# Patient Record
Sex: Female | Born: 1975 | Race: Black or African American | Hispanic: No | Marital: Married | Smoking: Never smoker
Health system: Southern US, Community
[De-identification: ages and names within clinical notes are randomized; demographics above are authoritative.]

## PROBLEM LIST (undated history)

## (undated) DIAGNOSIS — R519 Headache, unspecified: Secondary | ICD-10-CM

## (undated) DIAGNOSIS — F32A Depression, unspecified: Secondary | ICD-10-CM

## (undated) DIAGNOSIS — F419 Anxiety disorder, unspecified: Secondary | ICD-10-CM

## (undated) DIAGNOSIS — F329 Major depressive disorder, single episode, unspecified: Secondary | ICD-10-CM

## (undated) DIAGNOSIS — R51 Headache: Secondary | ICD-10-CM

## (undated) DIAGNOSIS — Z8669 Personal history of other diseases of the nervous system and sense organs: Secondary | ICD-10-CM

## (undated) DIAGNOSIS — Z87442 Personal history of urinary calculi: Secondary | ICD-10-CM

## (undated) DIAGNOSIS — R21 Rash and other nonspecific skin eruption: Secondary | ICD-10-CM

## (undated) DIAGNOSIS — J302 Other seasonal allergic rhinitis: Secondary | ICD-10-CM

## (undated) DIAGNOSIS — R7303 Prediabetes: Secondary | ICD-10-CM

## (undated) DIAGNOSIS — E785 Hyperlipidemia, unspecified: Secondary | ICD-10-CM

## (undated) DIAGNOSIS — M65841 Other synovitis and tenosynovitis, right hand: Secondary | ICD-10-CM

## (undated) HISTORY — DX: Personal history of urinary calculi: Z87.442

## (undated) HISTORY — PX: WISDOM TOOTH EXTRACTION: SHX21

---

## 2000-01-08 ENCOUNTER — Other Ambulatory Visit: Admission: RE | Admit: 2000-01-08 | Discharge: 2000-01-08 | Payer: Self-pay | Admitting: Obstetrics and Gynecology

## 2000-11-16 ENCOUNTER — Encounter: Payer: Self-pay | Admitting: Emergency Medicine

## 2000-11-16 ENCOUNTER — Emergency Department (HOSPITAL_COMMUNITY): Admission: EM | Admit: 2000-11-16 | Discharge: 2000-11-16 | Payer: Self-pay | Admitting: *Deleted

## 2000-11-20 ENCOUNTER — Emergency Department (HOSPITAL_COMMUNITY): Admission: EM | Admit: 2000-11-20 | Discharge: 2000-11-20 | Payer: Self-pay | Admitting: Emergency Medicine

## 2000-12-26 ENCOUNTER — Other Ambulatory Visit: Admission: RE | Admit: 2000-12-26 | Discharge: 2000-12-26 | Payer: Self-pay | Admitting: Obstetrics and Gynecology

## 2001-07-24 ENCOUNTER — Inpatient Hospital Stay (HOSPITAL_COMMUNITY): Admission: AD | Admit: 2001-07-24 | Discharge: 2001-07-26 | Payer: Self-pay | Admitting: Obstetrics and Gynecology

## 2002-03-24 ENCOUNTER — Other Ambulatory Visit: Admission: RE | Admit: 2002-03-24 | Discharge: 2002-03-24 | Payer: Self-pay | Admitting: Obstetrics and Gynecology

## 2002-08-30 ENCOUNTER — Other Ambulatory Visit: Admission: RE | Admit: 2002-08-30 | Discharge: 2002-08-30 | Payer: Self-pay | Admitting: Obstetrics and Gynecology

## 2003-01-28 ENCOUNTER — Other Ambulatory Visit: Admission: RE | Admit: 2003-01-28 | Discharge: 2003-01-28 | Payer: Self-pay | Admitting: Obstetrics and Gynecology

## 2003-06-03 ENCOUNTER — Other Ambulatory Visit: Admission: RE | Admit: 2003-06-03 | Discharge: 2003-06-03 | Payer: Self-pay | Admitting: Obstetrics and Gynecology

## 2004-06-12 ENCOUNTER — Other Ambulatory Visit: Admission: RE | Admit: 2004-06-12 | Discharge: 2004-06-12 | Payer: Self-pay | Admitting: Obstetrics and Gynecology

## 2004-07-30 ENCOUNTER — Emergency Department (HOSPITAL_COMMUNITY): Admission: EM | Admit: 2004-07-30 | Discharge: 2004-07-30 | Payer: Self-pay | Admitting: Emergency Medicine

## 2005-06-13 ENCOUNTER — Other Ambulatory Visit: Admission: RE | Admit: 2005-06-13 | Discharge: 2005-06-13 | Payer: Self-pay | Admitting: Obstetrics and Gynecology

## 2006-06-18 ENCOUNTER — Other Ambulatory Visit: Admission: RE | Admit: 2006-06-18 | Discharge: 2006-06-18 | Payer: Self-pay | Admitting: Obstetrics and Gynecology

## 2009-01-29 ENCOUNTER — Emergency Department (HOSPITAL_COMMUNITY): Admission: EM | Admit: 2009-01-29 | Discharge: 2009-01-29 | Payer: Self-pay | Admitting: Emergency Medicine

## 2011-01-23 LAB — POCT I-STAT, CHEM 8
BUN: 10 mg/dL (ref 6–23)
Calcium, Ion: 1.18 mmol/L (ref 1.12–1.32)
Chloride: 103 mEq/L (ref 96–112)
Creatinine, Ser: 0.9 mg/dL (ref 0.4–1.2)
Glucose, Bld: 83 mg/dL (ref 70–99)
HCT: 47 % — ABNORMAL HIGH (ref 36.0–46.0)
Hemoglobin: 16 g/dL — ABNORMAL HIGH (ref 12.0–15.0)
Potassium: 3.2 mEq/L — ABNORMAL LOW (ref 3.5–5.1)
Sodium: 139 mEq/L (ref 135–145)
TCO2: 28 mmol/L (ref 0–100)

## 2011-01-23 LAB — PREGNANCY, URINE: Preg Test, Ur: NEGATIVE

## 2011-01-23 LAB — URINALYSIS, ROUTINE W REFLEX MICROSCOPIC
Glucose, UA: NEGATIVE mg/dL
Hgb urine dipstick: NEGATIVE
Ketones, ur: 15 mg/dL — AB
Nitrite: NEGATIVE
Protein, ur: NEGATIVE mg/dL
Specific Gravity, Urine: 1.036 — ABNORMAL HIGH (ref 1.005–1.030)
Urobilinogen, UA: 1 mg/dL (ref 0.0–1.0)
pH: 6 (ref 5.0–8.0)

## 2011-01-23 LAB — URINE MICROSCOPIC-ADD ON

## 2011-03-01 NOTE — H&P (Signed)
[facility]  Patient:    Christine Fitzgerald, Christine Fitzgerald Visit Number: [id number] MRN: [id number]          Service Type: OBS Location: [id number] Attending Physician:  Jaymes Graff A Dictated by:   Saverio Danker, C.N.M. Admit Date:  07/24/2001                           History and Physical  HISTORY OF PRESENT ILLNESS:   Christine Fitzgerald is a 35 year old single black female, gravida 1, para 0, at 40-1/7 weeks by LMP, who presents complaining of uterine contractions every 2 to 3 minutes for the last several hours.  She denies any leaking or vaginal bleeding.  She denies any headache, nausea, vomiting or visual disturbances.  Her pregnancy has been followed at [facility] by the certified nurse midwife service and has been essentially uncomplicated though at risk for positive group B strep.  OBSTETRICAL/GYNECOLOGICAL HISTORY:  She is a primigravida with an LMP of October 16, 2000, giving her an [facility] of July 23, 2001.  ALLERGIES:                    She has no known drug allergies.  GENERAL MEDICAL HISTORY:      She reports having had the usual childhood diseases.  She has no other medical problems.  PAST SURGICAL HISTORY:        Her only surgery was wisdom teeth removed in 2000.  FAMILY HISTORY:               Her family history is significant for a grandmother with chronic hypertension, mother with varicosities, brother with asthma, maternal aunt with insulin-dependent diabetes.  GENETIC HISTORY:              Her genetic history is significant for patients nephew with a deformed left arm and a son of the father of the baby has a heart murmur.  SOCIAL HISTORY:               She is single.  The father of the baby is Christine Fitzgerald; he is minimally involved.  She has good support from her family.  She is of the [employer].  She denies any illicit drug use, alcohol or smoking with this pregnancy.  PRENATAL LABORATORY DATA:     Her blood type is  B-positive, her antibody screen is negative, sickle cell trait is negative, syphilis is nonreactive, rubella is immune, hepatitis B surface antigen is negative, HIV is nonreactive, GC and Chlamydia are both negative, Pap smear was within normal limits, one-hour glucola is within normal range and a maternal serum alpha-fetoprotein was also within normal range.  Her 36-week beta strep was positive.  PHYSICAL EXAMINATION:  VITAL SIGNS:                  Her vital signs are stable.  She is afebrile.  HEENT:                        Grossly within normal limits.  HEART:                        Regular rhythm and rate.  CHEST:                        Clear.  BREASTS:  Soft and nontender.  ABDOMEN:                      Gravid with uterine contractions every two to three minutes.  Her fetal heart rate is reactive and reassuring.  PELVIC:                       Completely dilated, vertex -2 with bulging membranes.  EXTREMITIES:                  Within normal limits.  ASSESSMENT:                   1. Intrauterine pregnancy at term.                               2. Positive group B streptococcus.                               3. Second stage.  PLAN:                         Our plan is to admit to [facility], to follow routine C.N.M. orders, to give her ampicillin for group B strep prophylaxis and to notify Dr. Pierre Bali. [place]. Dictated by:   Vance Gather Duplantis, C.N.M. Attending Physician:  Michael Litter DD:  07/24/01 TD:  07/24/01 Job: [id number]

## 2011-09-23 ENCOUNTER — Encounter (INDEPENDENT_AMBULATORY_CARE_PROVIDER_SITE_OTHER): Payer: BC Managed Care – PPO | Admitting: Family Medicine

## 2011-09-23 DIAGNOSIS — E049 Nontoxic goiter, unspecified: Secondary | ICD-10-CM

## 2011-09-23 DIAGNOSIS — Z0000 Encounter for general adult medical examination without abnormal findings: Secondary | ICD-10-CM

## 2011-09-23 DIAGNOSIS — Z23 Encounter for immunization: Secondary | ICD-10-CM

## 2012-04-09 ENCOUNTER — Ambulatory Visit (INDEPENDENT_AMBULATORY_CARE_PROVIDER_SITE_OTHER): Payer: BC Managed Care – PPO | Admitting: Family Medicine

## 2012-04-09 VITALS — BP 114/76 | HR 93 | Temp 98.9°F | Resp 16 | Ht 64.0 in | Wt 183.0 lb

## 2012-04-09 DIAGNOSIS — E039 Hypothyroidism, unspecified: Secondary | ICD-10-CM

## 2012-04-09 DIAGNOSIS — N939 Abnormal uterine and vaginal bleeding, unspecified: Secondary | ICD-10-CM

## 2012-04-09 DIAGNOSIS — N898 Other specified noninflammatory disorders of vagina: Secondary | ICD-10-CM

## 2012-04-09 DIAGNOSIS — K625 Hemorrhage of anus and rectum: Secondary | ICD-10-CM

## 2012-04-09 LAB — POCT WET PREP WITH KOH
KOH Prep POC: NEGATIVE
Trichomonas, UA: NEGATIVE
WBC Wet Prep HPF POC: NEGATIVE
Yeast Wet Prep HPF POC: NEGATIVE

## 2012-04-09 LAB — POCT UA - MICROSCOPIC ONLY
Bacteria, U Microscopic: NEGATIVE
Casts, Ur, LPF, POC: NEGATIVE
Crystals, Ur, HPF, POC: NEGATIVE
Epithelial cells, urine per micros: NEGATIVE
Mucus, UA: NEGATIVE
Yeast, UA: NEGATIVE

## 2012-04-09 LAB — POCT URINALYSIS DIPSTICK
Protein, UA: NEGATIVE
Spec Grav, UA: <=1.005
Urobilinogen, UA: 0.2

## 2012-04-09 LAB — POCT URINE PREGNANCY: Preg Test, Ur: NEGATIVE

## 2012-04-09 LAB — TSH: TSH: 1.278 u[IU]/mL (ref 0.350–4.500)

## 2012-04-09 LAB — IFOBT (OCCULT BLOOD): IFOBT: NEGATIVE

## 2012-04-09 NOTE — Progress Notes (Signed)
Patient Name: Kendelle Schweers Date of Birth: 02/12/1976 Medical Record Number: [id number] Gender: female Date of Encounter: 04/09/2012  History of Present Illness:  Catina Nuss is a 36 y.o. very pleasant female patient who presents with the following:  She has noted rectal and vaginal bleeding.  This past Saturday she noted LLQ pain which continued until Sunday. (Today is Thursday.)  She feels that she had vaginal and rectal bleeding at that time- she wiped and noted blood from both.  Then again today she noted bleeding from both areas- however she has no pain this time.  She thought she had better be evaluated to be sure nothing was amiss.   No dysuria or frequency.  No pain with BM, no BRBPR or melena.  No GI issues, no anorexia or diarrhea. She did have some loose stools last week, but these have resolved.    LMP 03/23/12- however the period seemed to be very light and kind of unusual.  She admits that she has been quite stressed lately.  She is not on any contraception.    Last TSH check about a year ago  There is no problem list on file for this patient.  No past medical history on file. No past surgical history on file. History  Substance Use Topics  . Smoking status: Never Smoker   . Smokeless tobacco: Not on file  . Alcohol Use: Not on file   No family history on file. No Known Allergies  Medication list has been reviewed and updated.  Prior to Admission medications   Medication Sig Start Date End Date Taking? Authorizing Provider  levothyroxine (SYNTHROID, LEVOTHROID) 50 MCG tablet Take 50 mcg by mouth daily.   Yes Historical Provider, MD    Review of Systems:  As per HPI- otherwise negative.   Physical Examination: Filed Vitals:   04/09/12 1316  BP: 114/76  Pulse: 93  Temp: 98.9 F (37.2 C)  Resp: 16   Filed Vitals:   04/09/12 1316  Height: 5\' 4"  (1.626 m)  Weight: 183 lb (83.008 kg)   Body mass index is 31.41 kg/(m^2). Ideal Body Weight: Weight in  (lb) to have BMI = 25: 145.3   GEN: WDWN, NAD, Non-toxic, A & O x 3, overweight HEENT: Atraumatic, Normocephalic. Neck supple. No masses, No LAD.  Oropharynx wnl Ears and Nose: No external deformity. CV: RRR, No M/G/R. No JVD. No thrill. No extra heart sounds. PULM: CTA B, no wheezes, crackles, rhonchi. No retractions. No resp. distress. No accessory muscle use. ABD: S, NT, ND. No rebound. No HSM. EXTR: No c/c/e NEURO Normal gait.  PSYCH: Normally interactive. Conversant. Not depressed or anxious appearing.  Calm demeanor.  GU: normal external genitalia.  Vaginal exam reveals a small amount of bleeding from the cervix/ uterus.  No lesions or abnormal discharge. Normal bimanual exam and normal adnexa.  There is no apparent rectal bleeding.    Results for orders placed in visit on 04/09/12  POCT URINE PREGNANCY      Component Value Range   Preg Test, Ur Negative    POCT UA - MICROSCOPIC ONLY      Component Value Range   WBC, Ur, HPF, POC 0-1     RBC, urine, microscopic 1-2     Bacteria, U Microscopic negative     Mucus, UA negative     Epithelial cells, urine per micros negative     Crystals, Ur, HPF, POC negative     Casts, Ur, LPF, POC negative  Yeast, UA negative    POCT URINALYSIS DIPSTICK      Component Value Range   Color, UA yellow     Clarity, UA clear     Glucose, UA negative     Bilirubin, UA negative     Ketones, UA negative     Spec Grav, UA <=1.005     Blood, UA trace-intact     pH, UA 6.0     Protein, UA negative     Urobilinogen, UA 0.2     Nitrite, UA negative     Leukocytes, UA Negative    IFOBT (OCCULT BLOOD)      Component Value Range   IFOBT Negative    POCT WET PREP WITH KOH      Component Value Range   Trichomonas, UA Negative     Clue Cells Wet Prep HPF POC negative     Epithelial Wet Prep HPF POC 0-2     Yeast Wet Prep HPF POC negative     Bacteria Wet Prep HPF POC trace     RBC Wet Prep HPF POC negative     WBC Wet Prep HPF POC negative       KOH Prep POC Negative     Assessment and Plan: 1. Vaginal bleeding  POCT urine pregnancy, POCT UA - Microscopic Only, POCT urinalysis dipstick, POCT Wet Prep with KOH, GC/chlamydia probe amp, genital  2. Rectal bleeding  IFOBT POC (occult bld, rslt in office)  3. Hypothyroid  TSH   It seems that Mariadejesus's bleeding is actually just from her vagina.  Her FOBT is negative today, and her exam shows only vaginal bleeding. She is likely having some spotting and menstrual cycle irregularity, which may be due to stress.  Also check a TSH and genprobe. Consider possibility of missed spontaneous abortion, but a urine HCG is negative today, making this much less likely.  She declined a blood HCG today.  As she is not in any pain at this point a dangerous etiology is unlikely.  She will call or RTC if her pain returns, or if she continues to have irregular bleeding.  Will plan further follow- up pending labs.    Abbe Amsterdam, MD

## 2012-04-10 ENCOUNTER — Encounter: Payer: Self-pay | Admitting: Family Medicine

## 2012-04-15 ENCOUNTER — Ambulatory Visit
Admission: RE | Admit: 2012-04-15 | Discharge: 2012-04-15 | Disposition: A | Discharge status: Home / Self Care | Payer: BC Managed Care – PPO | Source: Ambulatory Visit | Attending: Emergency Medicine | Admitting: Emergency Medicine

## 2012-04-15 ENCOUNTER — Ambulatory Visit (INDEPENDENT_AMBULATORY_CARE_PROVIDER_SITE_OTHER): Payer: BC Managed Care – PPO | Admitting: Emergency Medicine

## 2012-04-15 VITALS — BP 112/76 | HR 96 | Temp 98.5°F | Resp 18 | Ht 63.25 in | Wt 183.0 lb

## 2012-04-15 DIAGNOSIS — R109 Unspecified abdominal pain: Secondary | ICD-10-CM

## 2012-04-15 DIAGNOSIS — N200 Calculus of kidney: Secondary | ICD-10-CM

## 2012-04-15 DIAGNOSIS — R319 Hematuria, unspecified: Secondary | ICD-10-CM

## 2012-04-15 LAB — POCT URINALYSIS DIPSTICK
Glucose, UA: NEGATIVE
Leukocytes, UA: NEGATIVE
Nitrite, UA: NEGATIVE
Spec Grav, UA: 1.01
Urobilinogen, UA: 0.2

## 2012-04-15 LAB — POCT URINE PREGNANCY: Preg Test, Ur: NEGATIVE

## 2012-04-15 LAB — POCT CBC
MCH, POC: 28 pg (ref 27–31.2)
MID (cbc): 0.4 (ref 0–0.9)
MPV: 9.2 fL (ref 0–99.8)
POC MID %: 6.8 %M (ref 0–12)
Platelet Count, POC: 396 10*3/uL (ref 142–424)
RBC: 4.97 M/uL (ref 4.04–5.48)
RDW, POC: 13.1 %
WBC: 5.9 10*3/uL (ref 4.6–10.2)

## 2012-04-15 LAB — POCT UA - MICROSCOPIC ONLY
Casts, Ur, LPF, POC: NEGATIVE
Yeast, UA: NEGATIVE

## 2012-04-15 NOTE — Progress Notes (Signed)
Subjective:    Patient ID: Christine Fitzgerald, female    DOB: 1976/04/25, 36 y.o.   MRN: [id number]  HPI  patient enters for followup after recently being seen by Dr. Dallas Schimke for evaluation of abdominal pain. At that time was normal and stool was negative for blood today for recheck because she continues to have left lower quadrant abdominal pain she states she feels there is blood in her urine off and on. An urge to pee and sometimes is unable to pass her urine. She intermittently has had some blood in her stool but not recently. She had a pregnancy test here on her last visit and it was negative. Her last menstrual period was June 10.    Review of Systems     Objective:   Physical Exam  Eyes: Pupils are equal, round, and reactive to light.  Neck: No thyromegaly present.  Cardiovascular: Normal rate and regular rhythm.   Pulmonary/Chest: Effort normal and breath sounds normal.  Abdominal:       The abdomen was soft. There is significant tenderness deep in the left lower quadrant associated with abdominal guarding.    Results for orders placed in visit on 04/15/12  POCT CBC      Component Value Range   WBC 5.9  4.6 - 10.2 K/uL   Lymph, poc 1.6  0.6 - 3.4   POC LYMPH PERCENT 27.5  10 - 50 %L   MID (cbc) 0.4  0 - 0.9   POC MID % 6.8  0 - 12 %M   POC Granulocyte 3.9  2 - 6.9   Granulocyte percent 65.7  37 - 80 %G   RBC 4.97  4.04 - 5.48 M/uL   Hemoglobin 13.9  12.2 - 16.2 g/dL   HCT, POC 84.6  96.2 - 47.9 %   MCV 89.2  80 - 97 fL   MCH, POC 28.0  27 - 31.2 pg   MCHC 31.4 (*) 31.8 - 35.4 g/dL   RDW, POC 95.2     Platelet Count, POC 396  142 - 424 K/uL   MPV 9.2  0 - 99.8 fL  POCT UA - MICROSCOPIC ONLY      Component Value Range   WBC, Ur, HPF, POC 0-2     RBC, urine, microscopic TNTC     Bacteria, U Microscopic 1+     Mucus, UA trace     Epithelial cells, urine per micros 1-3     Crystals, Ur, HPF, POC neg     Casts, Ur, LPF, POC neg     Yeast, UA neg    POCT URINALYSIS  DIPSTICK      Component Value Range   Color, UA orange     Clarity, UA cloudy     Glucose, UA neg     Bilirubin, UA neg     Ketones, UA neg     Spec Grav, UA 1.010     Blood, UA large     pH, UA 5.5     Protein, UA 100     Urobilinogen, UA 0.2     Nitrite, UA neg     Leukocytes, UA Negative    POCT URINE PREGNANCY      Component Value Range   Preg Test, Ur Negative          Assessment & Plan:  I suspect the patient's try to pass a kidney stone to given strainers. to go imaging and have her CT done at 1220 at  grams per imaging.

## 2012-04-30 ENCOUNTER — Other Ambulatory Visit: Payer: Self-pay | Admitting: *Deleted

## 2012-04-30 DIAGNOSIS — N200 Calculus of kidney: Secondary | ICD-10-CM

## 2012-04-30 NOTE — Addendum Note (Signed)
Addended by: Thelma Barge D on: 04/30/2012 12:03 PM   Modules accepted: Orders

## 2012-05-05 LAB — STONE ANALYSIS: Stone Weight KSTONE: 0.032 g

## 2013-01-14 ENCOUNTER — Ambulatory Visit (INDEPENDENT_AMBULATORY_CARE_PROVIDER_SITE_OTHER): Payer: BC Managed Care – PPO | Admitting: Emergency Medicine

## 2013-01-14 VITALS — BP 109/72 | HR 80 | Temp 99.6°F | Resp 16 | Ht 63.75 in | Wt 181.0 lb

## 2013-01-14 DIAGNOSIS — K068 Other specified disorders of gingiva and edentulous alveolar ridge: Secondary | ICD-10-CM

## 2013-01-14 DIAGNOSIS — K056 Periodontal disease, unspecified: Secondary | ICD-10-CM

## 2013-01-14 DIAGNOSIS — R0981 Nasal congestion: Secondary | ICD-10-CM

## 2013-01-14 DIAGNOSIS — J3489 Other specified disorders of nose and nasal sinuses: Secondary | ICD-10-CM

## 2013-01-14 LAB — POCT CBC
Granulocyte percent: 67.1 %G (ref 37–80)
HCT, POC: 42.2 % (ref 37.7–47.9)
Hemoglobin: 13.8 g/dL (ref 12.2–16.2)
Lymph, poc: 2.3 (ref 0.6–3.4)
MCH, POC: 29.2 pg (ref 27–31.2)
MCHC: 32.7 g/dL (ref 31.8–35.4)
MCV: 89.2 fL (ref 80–97)
MID (cbc): 0.5 (ref 0–0.9)
MPV: 9.4 fL (ref 0–99.8)
POC Granulocyte: 5.6 (ref 2–6.9)
POC LYMPH PERCENT: 26.9 %L (ref 10–50)
POC MID %: 6 %M (ref 0–12)
Platelet Count, POC: 388 10*3/uL (ref 142–424)
RBC: 4.73 M/uL (ref 4.04–5.48)
RDW, POC: 12.9 %
WBC: 8.4 10*3/uL (ref 4.6–10.2)

## 2013-01-14 MED ORDER — VALACYCLOVIR HCL 1 G PO TABS: 2000.0000 mg | ORAL_TABLET | Freq: Two times a day (BID) | ORAL | Status: DC

## 2013-01-14 NOTE — Progress Notes (Signed)
Subjective:    Patient ID: Christine Fitzgerald, female    DOB: [birth year], 37 y.o.   MRN: [id number]  HPI 37 year old female presents for evaluation of gum pain. States she was treated empirically for flu last week due to clinically suspicious symptoms.  She had high fevers, nasal congestion, cough, and body aches.  States those symptoms significantly improved over the course of this week and then this morning she felt fatigued again.  Has had lingering nasal congestion and slight dry cough.  Says this morning her gums hurt while she was eating an orange.  They do not hurt to touch and have not bled or drained anything.  Teeth ok.  Also noticed a bump on her lower lip this morning - slightly painful to touch but otherwise she has not noticed it.  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     Review of Systems  Constitutional: Negative for fever and chills.  HENT: Positive for congestion and rhinorrhea. Negative for ear pain, sore throat, postnasal drip and sinus pressure.   Respiratory: Positive for cough. Negative for shortness of breath and wheezing.   Gastrointestinal: Negative for nausea and vomiting.  Skin: Positive for rash (bump on lip).  Neurological: Negative for dizziness and headaches.       Objective:   Physical Exam  Constitutional: She is oriented to person, place, and time. She appears well-developed and well-nourished.  HENT:  Head: Normocephalic and atraumatic.  Right Ear: Hearing, tympanic membrane, external ear and ear canal normal.  Left Ear: Hearing, tympanic membrane, external ear and ear canal normal.  Mouth/Throat: Uvula is midline and oropharynx is clear and moist. No oral lesions. Normal dentition. No dental abscesses or edematous. No oropharyngeal exudate, posterior oropharyngeal edema, posterior oropharyngeal erythema or tonsillar abscesses.  Gums not painful to palpation. No  abscess, induration, drainage, bleeding, or ulceration noted.   Eyes: Conjunctivae are normal.  Neck: Normal range of motion.  Cardiovascular: Normal rate.   Pulmonary/Chest: Effort normal and breath sounds normal.  Neurological: She is alert and oriented to person, place, and time.  Skin:  Lower lip has small <1 cm papule. No ulceration or blister formation.    Psychiatric: She has a normal mood and affect. Her behavior is normal. Judgment and thought content normal.     Results for orders placed in visit on 01/14/13  POCT CBC      Result Value Range   WBC 8.4  4.6 - 10.2 K/uL   Lymph, poc 2.3  0.6 - 3.4   POC LYMPH PERCENT 26.9  10 - 50 %L   MID (cbc) 0.5  0 - 0.9   POC MID % 6.0  0 - 12 %M   POC Granulocyte 5.6  2 - 6.9   Granulocyte percent 67.1  37 - 80 %G   RBC 4.73  4.04 - 5.48 M/uL   Hemoglobin 13.8  12.2 - 16.2 g/dL   HCT, POC 14.7  82.9 - 47.9 %   MCV 89.2  80 - 97 fL   MCH, POC 29.2  27 - 31.2 pg   MCHC 32.7  31.8 - 35.4 g/dL   RDW, POC 56.2     Platelet Count, POC 388  142 - 424 K/uL   MPV 9.4  0 - 99.8 fL        Assessment & Plan:  Pain in gums - Plan: POCT  CBC  Nasal congestion - Plan: POCT CBC  Likely early cold sore.  Go ahead and take Valtrex 2000 mg twice daily x 1 day.  RTC precautions including fever >100.0, drainage, nausea, vomiting, etc.  OOW today. Ok to go back on 01/18/13. If symptoms have not improved, may need recheck.

## 2013-08-08 ENCOUNTER — Ambulatory Visit (INDEPENDENT_AMBULATORY_CARE_PROVIDER_SITE_OTHER): Payer: BC Managed Care – PPO | Admitting: Internal Medicine

## 2013-08-08 VITALS — BP 110/70 | HR 83 | Temp 98.8°F | Resp 18 | Ht 64.5 in | Wt 181.0 lb

## 2013-08-08 DIAGNOSIS — Z833 Family history of diabetes mellitus: Secondary | ICD-10-CM

## 2013-08-08 DIAGNOSIS — N920 Excessive and frequent menstruation with regular cycle: Secondary | ICD-10-CM

## 2013-08-08 DIAGNOSIS — E039 Hypothyroidism, unspecified: Secondary | ICD-10-CM

## 2013-08-08 DIAGNOSIS — Z0000 Encounter for general adult medical examination without abnormal findings: Secondary | ICD-10-CM

## 2013-08-08 LAB — LIPID PANEL
HDL: 46 mg/dL (ref >39)
LDL Cholesterol: 114 mg/dL — ABNORMAL HIGH (ref 0–99)
Total CHOL/HDL Ratio: 3.6 Ratio
Triglycerides: 35 mg/dL (ref <150)

## 2013-08-08 LAB — COMPREHENSIVE METABOLIC PANEL
Albumin: 4.2 g/dL (ref 3.5–5.2)
Alkaline Phosphatase: 63 U/L (ref 39–117)
BUN: 10 mg/dL (ref 6–23)
Calcium: 9.5 mg/dL (ref 8.4–10.5)
Glucose, Bld: 80 mg/dL (ref 70–99)
Potassium: 4.4 mEq/L (ref 3.5–5.3)

## 2013-08-08 LAB — POCT CBC
Granulocyte percent: 61.4 %G (ref 37–80)
HCT, POC: 41.7 % (ref 37.7–47.9)
Lymph, poc: 1.5 (ref 0.6–3.4)
MCH, POC: 29.8 pg (ref 27–31.2)
MCHC: 32.6 g/dL (ref 31.8–35.4)
MCV: 91.5 fL (ref 80–97)
POC LYMPH PERCENT: 33.1 %L (ref 10–50)
RDW, POC: 13.3 %
WBC: 4.6 10*3/uL (ref 4.6–10.2)

## 2013-08-08 LAB — T4, FREE: Free T4: 1.13 ng/dL (ref 0.80–1.80)

## 2013-08-08 MED ORDER — NORGESTIMATE-ETH ESTRADIOL 0.25-35 MG-MCG PO TABS: 1.0000 | ORAL_TABLET | Freq: Every day | ORAL | Status: DC

## 2013-08-08 NOTE — Progress Notes (Signed)
This chart was scribed for Christine Sia, MD by [name], Medical Scribe. This patient was seen in Room/bed 9 and the patient's care was started at 10:32 AM.  Subjective:    Patient ID: Christine Fitzgerald, female    DOB: 27-Mar-1976, 37 y.o.   MRN: [id number]  HPI HPI Comments: Christine Fitzgerald is a 37 y.o. female with h/o thyroid disease who presents to [facility] requesting check up. Pt complains of irregular menstrual cycle. She states that they are never within 2.5 weeks of one another but come at different times monthly. Pt's LNMP was October 19. She denies discharge between periods. Pt doesn't take any contraceptives. The last time she had sex was last month. Pt has been with her current partner for 12 years. They do not want to have kids. Her last normal pap smear was December 2012. 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 Pt denies leg swelling or any pain. She walks and/or jogs daily and does daily exercise challenges with family members. Pt denies h/o allergies. She has family h/o DM.     Review of Systems  HENT: Negative.   Eyes: Negative.   Respiratory: Negative.   Cardiovascular: Negative.  Negative for leg swelling.  Gastrointestinal: Negative.   Endocrine:       Hx hypothy on syn 50 no recent labs  Genitourinary: Positive for menstrual problem. Negative for urgency, frequency, hematuria, vaginal discharge, difficulty urinating, pelvic pain and dyspareunia.  Neurological: Negative.   Hematological: Negative.   Psychiatric/Behavioral: Negative.    Past Medical History  Diagnosis Date  . Thyroid disease   . Allergy    History   Social History  . Marital Status: Married    Spouse Name: N/A    Number of Children: N/A  . Years of Education: N/A   Occupational History  . Not on file.   Social History Main Topics  . Smoking status: Never Smoker   . Smokeless tobacco: Not on file  . Alcohol Use: Not on file  . Drug Use: Not on file  . Sexual Activity: Not on file   Other  Topics Concern  . Not on file   Social History Narrative  . No narrative on file   History reviewed. No pertinent past surgical history. Family History  Problem Relation Age of Onset  . Diabetes Mother   . Hypertension Mother   . Hyperlipidemia Mother   . Glaucoma Mother   . Prostatitis Father    No Known Allergies      Objective:   Physical Exam  Nursing note and vitals reviewed. Constitutional: She is oriented to person, place, and time. She appears well-developed and well-nourished. No distress.  HENT:  Head: Normocephalic and atraumatic.  Right Ear: External ear normal.  Left Ear: External ear normal.  Nose: Nose normal.  Mouth/Throat: Oropharynx is clear and moist.  Eyes: Conjunctivae and EOM are normal. Pupils are equal, round, and reactive to light.  Neck: Neck supple. No thyromegaly present.  Cardiovascular: Normal rate, regular rhythm, normal heart sounds and intact distal pulses.  Exam reveals no gallop and no friction rub.   No murmur heard. Pulmonary/Chest: Effort normal and breath sounds normal. No respiratory distress. She has no wheezes. She has no rales. She exhibits no tenderness.  Abdominal: Soft. Bowel sounds are normal. She exhibits no distension and no mass.  Musculoskeletal: Normal range of motion. She exhibits no edema and no tenderness.  Lymphadenopathy:    She has no cervical adenopathy.  Neurological: She  is alert and oriented to person, place, and time. She has normal reflexes.  Skin: Skin is warm and dry. She is not diaphoretic.  Psychiatric: She has a normal mood and affect. Her behavior is normal. Judgment and thought content normal.    Results for orders placed in visit on 08/08/13  POCT CBC      Result Value Range   WBC 4.6  4.6 - 10.2 K/uL   Lymph, poc 1.5  0.6 - 3.4   POC LYMPH PERCENT 33.1  10 - 50 %L   MID (cbc) 0.3  0 - 0.9   POC MID % 5.5  0 - 12 %M   POC Granulocyte 2.8  2 - 6.9   Granulocyte percent 61.4  37 - 80 %G   RBC  4.56  4.04 - 5.48 M/uL   Hemoglobin 13.6  12.2 - 16.2 g/dL   HCT, POC 40.9  81.1 - 47.9 %   MCV 91.5  80 - 97 fL   MCH, POC 29.8  27 - 31.2 pg   MCHC 32.6  31.8 - 35.4 g/dL   RDW, POC 91.4     Platelet Count, POC 337  142 - 424 K/uL   MPV 9.6  0 - 99.8 fL  POCT GLYCOSYLATED HEMOGLOBIN (HGB A1C)      Result Value Range   Hemoglobin A1C 5.2           Assessment & Plan:  Routine general medical examination at a health care facility - Plan: POCT CBC, POCT glycosylated hemoglobin (Hb A1C), T4, free, TSH, Lipid panel, GC/Chlamydia Probe Amp, Comprehensive metabolic panel  Family history of diabetes mellitus (DM) - Plan: POCT glycosylated hemoglobin (Hb A1C), Comprehensive metabolic panel  Unspecified hypothyroidism - Plan: T4, free, TSH, Comprehensive metabolic panel  Menorrhagia - Plan: POCT CBC, GC/Chlamydia Probe Amp, Comprehensive metabolic panel, norgestimate-ethinyl estradiol (SPRINTEC 28) 0.25-35 MG-MCG tablet  Notify labs Start OCPs

## 2013-11-05 ENCOUNTER — Encounter (INDEPENDENT_AMBULATORY_CARE_PROVIDER_SITE_OTHER): Payer: Self-pay

## 2013-11-05 ENCOUNTER — Ambulatory Visit (INDEPENDENT_AMBULATORY_CARE_PROVIDER_SITE_OTHER): Payer: BC Managed Care – PPO | Admitting: Family Medicine

## 2013-11-05 ENCOUNTER — Ambulatory Visit (HOSPITAL_COMMUNITY)
Admission: RE | Admit: 2013-11-05 | Discharge: 2013-11-05 | Disposition: A | Discharge status: Home / Self Care | Payer: BC Managed Care – PPO | Source: Ambulatory Visit | Attending: Family Medicine | Admitting: Family Medicine

## 2013-11-05 ENCOUNTER — Encounter: Payer: Self-pay | Admitting: Family Medicine

## 2013-11-05 VITALS — BP 148/96 | HR 93 | Resp 16 | Ht 64.5 in | Wt 180.8 lb

## 2013-11-05 DIAGNOSIS — M545 Low back pain, unspecified: Secondary | ICD-10-CM

## 2013-11-05 DIAGNOSIS — E049 Nontoxic goiter, unspecified: Secondary | ICD-10-CM

## 2013-11-05 DIAGNOSIS — E8881 Metabolic syndrome: Secondary | ICD-10-CM

## 2013-11-05 DIAGNOSIS — G4700 Insomnia, unspecified: Secondary | ICD-10-CM

## 2013-11-05 DIAGNOSIS — B009 Herpesviral infection, unspecified: Secondary | ICD-10-CM

## 2013-11-05 DIAGNOSIS — N920 Excessive and frequent menstruation with regular cycle: Secondary | ICD-10-CM

## 2013-11-05 DIAGNOSIS — N946 Dysmenorrhea, unspecified: Secondary | ICD-10-CM

## 2013-11-05 DIAGNOSIS — E039 Hypothyroidism, unspecified: Secondary | ICD-10-CM

## 2013-11-05 DIAGNOSIS — R21 Rash and other nonspecific skin eruption: Secondary | ICD-10-CM

## 2013-11-05 DIAGNOSIS — E010 Iodine-deficiency related diffuse (endemic) goiter: Secondary | ICD-10-CM

## 2013-11-05 DIAGNOSIS — Z23 Encounter for immunization: Secondary | ICD-10-CM

## 2013-11-05 DIAGNOSIS — E66811 Obesity, class 1: Secondary | ICD-10-CM

## 2013-11-05 DIAGNOSIS — E669 Obesity, unspecified: Secondary | ICD-10-CM

## 2013-11-05 MED ORDER — PREDNISONE (PAK) 5 MG PO TABS: 5.0000 mg | ORAL_TABLET | ORAL | Status: DC

## 2013-11-05 MED ORDER — IBUPROFEN 800 MG PO TABS: 800.0000 mg | ORAL_TABLET | Freq: Three times a day (TID) | ORAL | Status: DC | PRN

## 2013-11-05 MED ORDER — TEMAZEPAM 7.5 MG PO CAPS: 7.5000 mg | ORAL_CAPSULE | Freq: Every evening | ORAL | Status: DC | PRN

## 2013-11-05 MED ORDER — METHYLPREDNISOLONE ACETATE 80 MG/ML IJ SUSP
80.0000 mg | Freq: Once | INTRAMUSCULAR | Status: AC
  Administered 2013-11-05: 80 mg via INTRAMUSCULAR

## 2013-11-05 MED ORDER — KETOROLAC TROMETHAMINE 60 MG/2ML IM SOLN
60.0000 mg | Freq: Once | INTRAMUSCULAR | Status: AC
  Administered 2013-11-05: 60 mg via INTRAMUSCULAR

## 2013-11-05 MED ORDER — HYDROCODONE-ACETAMINOPHEN 5-325 MG PO TABS: ORAL_TABLET | ORAL | Status: DC

## 2013-11-05 NOTE — Progress Notes (Signed)
   Subjective:    Patient ID: Blake DivineMarcia Sedeno, female    DOB: June 29, 1976, 38 y.o.   MRN: [id number]  HPI New pt evaluation of a generally healthy young lady troubled , since end October with intermittent disabling low back pain, radiating to right hip. Pain at times awakens the pt, and she has had to be out of work on 5 different occasions due to severe pain.  Prolonged sitting or standing aggravates the pain, she denies radiation to groin or lower extremities, she has no incontinence of stool or urine. C/o excessive stress both on the job and at home, she is a single parent who does have the help of her Mom who lives with her, her daughter is 37 y/o, she reports poor sleep with early  Awaking within 4 hrs of falling asleep. C/o excessively  heavy menses, with clotting and flooding despite being on the OCP, and extreme menstrual pain, this has been going on for along time.c/o new areas on both breasts, areolar areas, itchy and scaly, just bought prep with shea butter to try on the areas, no f/h breast cancer, no c/o abnormal breast d/c or mass Concerned about weight and is working on lifestyle change to improve health   Review of Systems See HPI Denies recent fever or chills. Denies sinus pressure, nasal congestion, ear pain or sore throat. Denies chest congestion, productive cough or wheezing. Denies chest pains, palpitations and leg swelling Denies abdominal pain, nausea, vomiting,diarrhea or constipation.   Denies dysuria, frequency, hesitancy or incontinence.  Denies headaches, seizures, numbness, or tingling.       Objective:   Physical Exam  Patient alert and oriented and in no cardiopulmonary distress.Pleasant   HEENT: No facial asymmetry, EOMI, no sinus tenderness,  oropharynx pink and moist.  Neck supple no adenopathy.  Chest: Clear to auscultation bilaterally.  CVS: S1, S2 no murmurs, no S3.  ABD: Soft non tender.  Ext: No edema  MS: Decreased ROM spine, significant pain  on lying flat, adequate in  shoulders, hips and knees.  Skin: Intact,  rash noted in areolar regions of both breasts, no skin breakdown or cracking in areas involved  Psych: Good eye contact, normal affect. Memory intact not anxious or depressed appearing.  CNS: CN 2-12 intact, power, tone and sensation normal throughout.       Assessment & Plan:

## 2013-11-05 NOTE — Patient Instructions (Addendum)
F/u in 6 weeks, call if you need me before Vaccines today.   For back pain, 2 injections in office and medication is sent in also. Xray low back today at hospital please   Work excuse for today return on Monday   You will be referred to gyne re menstrual pain and flooding  You will be contacted re skin concerns , in the interim use the cream you have already bought with shea butter

## 2013-11-07 DIAGNOSIS — N920 Excessive and frequent menstruation with regular cycle: Secondary | ICD-10-CM | POA: Insufficient documentation

## 2013-11-07 DIAGNOSIS — R21 Rash and other nonspecific skin eruption: Secondary | ICD-10-CM | POA: Insufficient documentation

## 2013-11-07 DIAGNOSIS — E8881 Metabolic syndrome: Secondary | ICD-10-CM | POA: Insufficient documentation

## 2013-11-07 DIAGNOSIS — E669 Obesity, unspecified: Secondary | ICD-10-CM | POA: Insufficient documentation

## 2013-11-07 DIAGNOSIS — B009 Herpesviral infection, unspecified: Secondary | ICD-10-CM | POA: Insufficient documentation

## 2013-11-07 DIAGNOSIS — E010 Iodine-deficiency related diffuse (endemic) goiter: Secondary | ICD-10-CM | POA: Insufficient documentation

## 2013-11-07 MED ORDER — CLOTRIMAZOLE-BETAMETHASONE 1-0.05 % EX CREA: TOPICAL_CREAM | CUTANEOUS | Status: DC

## 2013-11-07 NOTE — Assessment & Plan Note (Signed)
Poor sleep due to a combination of disabling back pin as well as anxiety and stress, a lot of responsibility on the job, also at home  Sleep hygiene discuseed and to strart restoril

## 2013-11-07 NOTE — Assessment & Plan Note (Signed)
Normal function on no med, h/o goiter , may need re imaging, will f/u on this

## 2013-11-07 NOTE — Assessment & Plan Note (Signed)
Pt to use shea butter to area, will need to f/u, will prescribe topical antifungal/steroid combination, if persists will refer either to derm or surgery, bilateral , low probability of pagets

## 2013-11-07 NOTE — Assessment & Plan Note (Signed)
Longstanding h/o excessive menstrual pain with flooding and clotting, despite use of contraception, refer to gyne for further management

## 2013-11-07 NOTE — Assessment & Plan Note (Signed)
The increased risk of cardiovascular disease associated with this diagnosis, and the need to consistently work on lifestyle to change this is discussed. Following  a  heart healthy diet ,commitment to 30 minutes of exercise at least 5 days per week, as well as control of blood sugar and cholesterol , and achieving a healthy weight are all the areas to be addressed .  

## 2013-11-07 NOTE — Assessment & Plan Note (Signed)
Severe back pain interfering with dailky activity also disturbing sleep x 2 3 month, localized and non radiating, xray, ant inflammatory and work excuse today. If nothing specific and no response based om n debility reported and witnessed , will refer to spine surgeon for further eval

## 2013-11-07 NOTE — Assessment & Plan Note (Signed)
  Patient educated about  the importance of commitment to a  minimum of 150 minutes of exercise per week.Currently limited by back pain. The importance of healthy food choices with portion control discussed. Encouraged to start a food diary, count calories and to consider  joining a support group.already has partnered with her family in this effort Sample diet sheets offered. Goals set by the patient for the next several months.

## 2013-11-11 ENCOUNTER — Ambulatory Visit (HOSPITAL_COMMUNITY)
Admission: RE | Admit: 2013-11-11 | Discharge: 2013-11-11 | Disposition: A | Discharge status: Home / Self Care | Payer: BC Managed Care – PPO | Source: Ambulatory Visit | Attending: Family Medicine | Admitting: Family Medicine

## 2013-11-11 DIAGNOSIS — M545 Low back pain, unspecified: Secondary | ICD-10-CM

## 2013-11-15 ENCOUNTER — Telehealth: Payer: Self-pay | Admitting: Family Medicine

## 2013-11-15 NOTE — Telephone Encounter (Signed)
Spoke with patient and she is aware of results.   Will call back with whom she would like to see.

## 2013-12-23 ENCOUNTER — Ambulatory Visit: Payer: BC Managed Care – PPO | Admitting: Family Medicine

## 2013-12-30 ENCOUNTER — Ambulatory Visit (INDEPENDENT_AMBULATORY_CARE_PROVIDER_SITE_OTHER): Payer: BC Managed Care – PPO | Admitting: Family Medicine

## 2013-12-30 ENCOUNTER — Encounter: Payer: Self-pay | Admitting: Family Medicine

## 2013-12-30 ENCOUNTER — Encounter (INDEPENDENT_AMBULATORY_CARE_PROVIDER_SITE_OTHER): Payer: Self-pay

## 2013-12-30 VITALS — BP 130/68 | HR 84 | Resp 18 | Ht 64.5 in | Wt 172.1 lb

## 2013-12-30 DIAGNOSIS — R079 Chest pain, unspecified: Secondary | ICD-10-CM

## 2013-12-30 DIAGNOSIS — F329 Major depressive disorder, single episode, unspecified: Secondary | ICD-10-CM

## 2013-12-30 DIAGNOSIS — F3289 Other specified depressive episodes: Secondary | ICD-10-CM

## 2013-12-30 DIAGNOSIS — E669 Obesity, unspecified: Secondary | ICD-10-CM

## 2013-12-30 DIAGNOSIS — E010 Iodine-deficiency related diffuse (endemic) goiter: Secondary | ICD-10-CM

## 2013-12-30 DIAGNOSIS — F32A Depression, unspecified: Secondary | ICD-10-CM | POA: Insufficient documentation

## 2013-12-30 DIAGNOSIS — E049 Nontoxic goiter, unspecified: Secondary | ICD-10-CM

## 2013-12-30 MED ORDER — FLUOXETINE HCL 10 MG PO TABS: 10.0000 mg | ORAL_TABLET | Freq: Every day | ORAL | Status: DC

## 2013-12-30 NOTE — Progress Notes (Signed)
   Subjective:    Patient ID: Christine Fitzgerald, female    DOB: 1975/11/02, 38 y.o.   MRN: [id number]  HPI The PT is here for follow up and re-evaluation of chronic medical conditions, medication management and review of any available recent lab and radiology data.  Preventive health is updated, specifically  Cancer screening and Immunization.   She has had gyne eval, and has f/u , may need ablation for heavy menses. States she has not been to ortho, reassured since MRI normal, states back pain may be stress related, apart form with her menses. C/o left chest pain , with and without activity, no associated diaphoresis, nausea or light headedness in the past she has had 2 episodes in the past week, this is new. C/o "no get up and go" states people note she is "not the same person she once was", feels unfulfilled in her life, and work constantly overwhelms her. Full depression screen confirms depression and she is now ready for both medication and counseling, states her daughter sees a counselor who she wants to see also      Review of Systems See HPI Denies recent fever or chills. Denies sinus pressure, nasal congestion, ear pain or sore throat. Denies chest congestion, productive cough or wheezing. Denies chest pains, palpitations and leg swelling Denies abdominal pain, nausea, vomiting,diarrhea or constipation.   Denies dysuria, frequency, hesitancy or incontinence. Denies headaches, seizures, numbness, or tingling.  Denies skin break down or rash.        Objective:   Physical Exam BP 130/68  Pulse 84  Resp 18  Ht 5' 4.5" (1.638 m)  Wt 172 lb 1.9 oz (78.073 kg)  BMI 29.10 kg/m2  SpO2 100% Patient alert and oriented and in no cardiopulmonary distress.  HEENT: No facial asymmetry, EOMI, no sinus tenderness,  oropharynx pink and moist.  Neck supple no adenopathy.Thyromegaly  Chest: Clear to auscultation bilaterally.No reproducible chest wall pain  CVS: S1, S2 no murmurs, no  S3. EKG: NSR, no ischemia, no LVH, normal EKG ABD: Soft non tender. Bowel sounds normal.  Ext: No edema  MS: Adequate ROM spine, shoulders, hips and knees.  Skin: Intact, no ulcerations or rash noted.  Psych:Fairly Good eye contact, at times very poor eye contact,tearful. Memory intact mildly  anxious and  depressed appearing.  CNS: CN 2-12 intact, power, tone and sensation normal throughout.        Assessment & Plan:  Depression Pt will start medication and is also referred to psychologist of her choice for counseling. F/u in 6 to 8 weeks. Regular exercise to be started also  Thyromegaly Thyromegaly on physical exam., rept thyroid us   Chest pain New complaint of chest pain, atypical as far as history is concerned. EKG in office today is entirely normal. Pt has a lot of anxiety and is  Depressed and I believe that she is somatizing also unconciously She is reassured that EKG is normal  Obesity (BMI 30.0-34.9) Improved. Pt applauded on succesful weight loss through lifestyle change, and encouraged to continue same. Weight loss goal set for the next several months.

## 2013-12-30 NOTE — Patient Instructions (Signed)
F/u in 6 weeks, call if you need me before  You are referred to therapist of your choice.  You are to start new medication daily for depression, this WILL help how you feel and function  Congrats on weight loss and please commit to starting exercise  You are referred for an US of your thyroid gland  EKG is normal, no sign of heart damage

## 2014-01-01 DIAGNOSIS — R079 Chest pain, unspecified: Secondary | ICD-10-CM | POA: Insufficient documentation

## 2014-01-01 NOTE — Assessment & Plan Note (Signed)
Thyromegaly on physical exam., rept thyroid us

## 2014-01-01 NOTE — Assessment & Plan Note (Signed)
Pt will start medication and is also referred to psychologist of her choice for counseling. F/u in 6 to 8 weeks. Regular exercise to be started also

## 2014-01-01 NOTE — Assessment & Plan Note (Signed)
Improved. Pt applauded on succesful weight loss through lifestyle change, and encouraged to continue same. Weight loss goal set for the next several months.  

## 2014-01-01 NOTE — Assessment & Plan Note (Signed)
New complaint of chest pain, atypical as far as history is concerned. EKG in office today is entirely normal. Pt has a lot of anxiety and is  Depressed and I believe that she is somatizing also unconciously She is reassured that EKG is normal

## 2014-01-04 ENCOUNTER — Ambulatory Visit (HOSPITAL_COMMUNITY)
Admission: RE | Admit: 2014-01-04 | Discharge: 2014-01-04 | Disposition: A | Discharge status: Home / Self Care | Payer: BC Managed Care – PPO | Source: Ambulatory Visit | Attending: Family Medicine | Admitting: Family Medicine

## 2014-01-04 DIAGNOSIS — E010 Iodine-deficiency related diffuse (endemic) goiter: Secondary | ICD-10-CM

## 2014-01-04 DIAGNOSIS — E049 Nontoxic goiter, unspecified: Secondary | ICD-10-CM | POA: Insufficient documentation

## 2014-01-07 ENCOUNTER — Other Ambulatory Visit: Payer: Self-pay | Admitting: Obstetrics and Gynecology

## 2014-01-10 ENCOUNTER — Encounter (HOSPITAL_COMMUNITY): Payer: Self-pay | Admitting: Pharmacist

## 2014-01-17 ENCOUNTER — Telehealth: Payer: Self-pay

## 2014-01-17 ENCOUNTER — Other Ambulatory Visit: Payer: Self-pay | Admitting: Family Medicine

## 2014-01-17 DIAGNOSIS — R1013 Epigastric pain: Secondary | ICD-10-CM

## 2014-01-17 DIAGNOSIS — R110 Nausea: Secondary | ICD-10-CM

## 2014-01-17 DIAGNOSIS — F4541 Pain disorder exclusively related to psychological factors: Secondary | ICD-10-CM

## 2014-01-17 NOTE — Telephone Encounter (Signed)
Pt states she continues to have headache and nausea which she has had for some time.She has not been taking any medication for either  Symptom regularly. She has endometrial ablation this week and wants to know what is a safe medication to take States she has headaches  Every week  day while working , none on the weekends, also states she feels nauseous esp on a Monday morning, no nausea on weekends, no vomit.I advised the patient that  Tylenol is safe for headache management before her  Procedure,  Since will not increase her risk of bleeeding however since she has not regularly been taking any medication would not advise she start now, she will be referred to the headache clinic since her neurologic exam is normal, I did tell her that I believe her gh headaches are stress related. As far as nausea is concerned they also I believe are stress related based on when they occur. Had normal abdominal scan in 2013, does want gall bladder re evaluated so I will refer for US of gall bladder, if negative, I would recommend GI eval for chronic nausea and she needs an H pylori test also Of note, she has not started the antidepressant prescribed at her last visit yet, and  She states that the one therapy session which she had this past Friday "did not go very well, not sure if it will help her"  Please call patient and explain that I recommend H pylori blood test for chronic nausea and explain, and send lab order to [facility] , she can go to lab in [place] where she [place], prefer that she has blood draw no later than tomorrow, does not need to fast.  Pls order referrals to headache clinic for chronic headache, also for RUQ ultrasound eval nausea, I will sign both the blood test, thank you!

## 2014-01-18 NOTE — Telephone Encounter (Signed)
Patient unable to talk on the phone when called.  She will call the office when able.

## 2014-01-20 ENCOUNTER — Ambulatory Visit (HOSPITAL_COMMUNITY)
Admission: RE | Admit: 2014-01-20 | Discharge: 2014-01-20 | Disposition: A | Discharge status: Home / Self Care | Payer: BC Managed Care – PPO | Source: Ambulatory Visit | Attending: Obstetrics and Gynecology | Admitting: Obstetrics and Gynecology

## 2014-01-20 ENCOUNTER — Encounter (HOSPITAL_COMMUNITY): Payer: BC Managed Care – PPO | Admitting: Certified Registered"

## 2014-01-20 ENCOUNTER — Ambulatory Visit (HOSPITAL_COMMUNITY): Payer: BC Managed Care – PPO | Admitting: Certified Registered"

## 2014-01-20 ENCOUNTER — Encounter (HOSPITAL_COMMUNITY): Payer: Self-pay

## 2014-01-20 ENCOUNTER — Encounter (HOSPITAL_COMMUNITY)
Admission: RE | Disposition: A | Discharge status: Home / Self Care | Payer: Self-pay | Source: Ambulatory Visit | Attending: Obstetrics and Gynecology

## 2014-01-20 DIAGNOSIS — N7013 Chronic salpingitis and oophoritis: Secondary | ICD-10-CM | POA: Insufficient documentation

## 2014-01-20 DIAGNOSIS — E039 Hypothyroidism, unspecified: Secondary | ICD-10-CM | POA: Insufficient documentation

## 2014-01-20 DIAGNOSIS — E0500 Thyrotoxicosis with diffuse goiter without thyrotoxic crisis or storm: Secondary | ICD-10-CM | POA: Diagnosis not present

## 2014-01-20 DIAGNOSIS — N736 Female pelvic peritoneal adhesions (postinfective): Secondary | ICD-10-CM | POA: Diagnosis not present

## 2014-01-20 DIAGNOSIS — N946 Dysmenorrhea, unspecified: Secondary | ICD-10-CM | POA: Diagnosis not present

## 2014-01-20 DIAGNOSIS — N949 Unspecified condition associated with female genital organs and menstrual cycle: Secondary | ICD-10-CM | POA: Diagnosis present

## 2014-01-20 DIAGNOSIS — N7011 Chronic salpingitis: Secondary | ICD-10-CM

## 2014-01-20 DIAGNOSIS — N840 Polyp of corpus uteri: Secondary | ICD-10-CM | POA: Diagnosis not present

## 2014-01-20 DIAGNOSIS — N920 Excessive and frequent menstruation with regular cycle: Secondary | ICD-10-CM | POA: Insufficient documentation

## 2014-01-20 DIAGNOSIS — Z87442 Personal history of urinary calculi: Secondary | ICD-10-CM | POA: Diagnosis not present

## 2014-01-20 HISTORY — PX: ROBOTIC ASSISTED LAPAROSCOPIC LYSIS OF ADHESION: SHX6080

## 2014-01-20 HISTORY — PX: DILATATION & CURRETTAGE/HYSTEROSCOPY WITH RESECTOCOPE: SHX5572

## 2014-01-20 HISTORY — PX: LAPAROSCOPY: SHX197

## 2014-01-20 LAB — CBC
HCT: 40.2 % (ref 36.0–46.0)
HEMOGLOBIN: 13.8 g/dL (ref 12.0–15.0)
MCH: 29.6 pg (ref 26.0–34.0)
MCHC: 34.3 g/dL (ref 30.0–36.0)
MCV: 86.3 fL (ref 78.0–100.0)
PLATELETS: 376 10*3/uL (ref 150–400)
RBC: 4.66 MIL/uL (ref 3.87–5.11)
RDW: 12.2 % (ref 11.5–15.5)
WBC: 5.8 10*3/uL (ref 4.0–10.5)

## 2014-01-20 SURGERY — DILATATION & CURETTAGE/HYSTEROSCOPY WITH RESECTOCOPE
Anesthesia: General | Site: Vagina

## 2014-01-20 MED ORDER — PHENYLEPHRINE HCL 10 MG/ML IJ SOLN
INTRAMUSCULAR | Status: DC | PRN
  Administered 2014-01-20: 80 ug via INTRAVENOUS
  Administered 2014-01-20: 40 ug via INTRAVENOUS

## 2014-01-20 MED ORDER — LIDOCAINE HCL (CARDIAC) 20 MG/ML IV SOLN
INTRAVENOUS | Status: AC
  Filled 2014-01-20: qty 5

## 2014-01-20 MED ORDER — GLYCOPYRROLATE 0.2 MG/ML IJ SOLN
INTRAMUSCULAR | Status: DC | PRN
  Administered 2014-01-20: 0.6 mg via INTRAVENOUS

## 2014-01-20 MED ORDER — ROPIVACAINE HCL 5 MG/ML IJ SOLN
INTRAMUSCULAR | Status: DC | PRN
  Administered 2014-01-20: 60 mL

## 2014-01-20 MED ORDER — ROCURONIUM BROMIDE 100 MG/10ML IV SOLN
INTRAVENOUS | Status: AC
  Filled 2014-01-20: qty 1

## 2014-01-20 MED ORDER — DOXYCYCLINE HYCLATE 100 MG IV SOLR
100.0000 mg | Freq: Once | INTRAVENOUS | Status: AC
  Administered 2014-01-20: 100 mg via INTRAVENOUS
  Filled 2014-01-20: qty 100

## 2014-01-20 MED ORDER — KETOROLAC TROMETHAMINE 30 MG/ML IJ SOLN
INTRAMUSCULAR | Status: AC
  Filled 2014-01-20: qty 1

## 2014-01-20 MED ORDER — ROPIVACAINE HCL 5 MG/ML IJ SOLN
INTRAMUSCULAR | Status: AC
  Filled 2014-01-20: qty 60

## 2014-01-20 MED ORDER — MIDAZOLAM HCL 2 MG/2ML IJ SOLN
INTRAMUSCULAR | Status: AC
  Filled 2014-01-20: qty 2

## 2014-01-20 MED ORDER — LACTATED RINGERS IV SOLN
INTRAVENOUS | Status: DC
  Administered 2014-01-20: 15:00:00 via INTRAVENOUS
  Administered 2014-01-20: 125 mL/h via INTRAVENOUS

## 2014-01-20 MED ORDER — KETOROLAC TROMETHAMINE 30 MG/ML IJ SOLN
INTRAMUSCULAR | Status: DC | PRN
  Administered 2014-01-20: 30 mg via INTRAMUSCULAR
  Administered 2014-01-20: 30 mg via INTRAVENOUS

## 2014-01-20 MED ORDER — DEXAMETHASONE SODIUM PHOSPHATE 10 MG/ML IJ SOLN
INTRAMUSCULAR | Status: DC | PRN
  Administered 2014-01-20: 10 mg via INTRAVENOUS

## 2014-01-20 MED ORDER — LIDOCAINE HCL (CARDIAC) 20 MG/ML IV SOLN
INTRAVENOUS | Status: DC | PRN
  Administered 2014-01-20: 80 mg via INTRAVENOUS

## 2014-01-20 MED ORDER — FENTANYL CITRATE 0.05 MG/ML IJ SOLN
INTRAMUSCULAR | Status: AC
  Filled 2014-01-20: qty 5

## 2014-01-20 MED ORDER — SODIUM CHLORIDE 0.9 % IJ SOLN
INTRAMUSCULAR | Status: AC
  Filled 2014-01-20: qty 50

## 2014-01-20 MED ORDER — HYDROMORPHONE HCL PF 1 MG/ML IJ SOLN
0.2500 mg | INTRAMUSCULAR | Status: DC | PRN
  Administered 2014-01-20 (×2): 0.5 mg via INTRAVENOUS

## 2014-01-20 MED ORDER — NEOSTIGMINE METHYLSULFATE 1 MG/ML IJ SOLN
INTRAMUSCULAR | Status: DC | PRN
  Administered 2014-01-20: 3 mg via INTRAVENOUS

## 2014-01-20 MED ORDER — SODIUM CHLORIDE 0.9 % IJ SOLN
INTRAMUSCULAR | Status: AC
  Filled 2014-01-20: qty 10

## 2014-01-20 MED ORDER — ONDANSETRON HCL 4 MG/2ML IJ SOLN
INTRAMUSCULAR | Status: AC
  Filled 2014-01-20: qty 2

## 2014-01-20 MED ORDER — DEXAMETHASONE SODIUM PHOSPHATE 10 MG/ML IJ SOLN
INTRAMUSCULAR | Status: AC
  Filled 2014-01-20: qty 1

## 2014-01-20 MED ORDER — MIDAZOLAM HCL 2 MG/2ML IJ SOLN
INTRAMUSCULAR | Status: DC | PRN
  Administered 2014-01-20: 2 mg via INTRAVENOUS

## 2014-01-20 MED ORDER — ONDANSETRON HCL 4 MG/2ML IJ SOLN
INTRAMUSCULAR | Status: DC | PRN
  Administered 2014-01-20: 4 mg via INTRAVENOUS

## 2014-01-20 MED ORDER — HYDROMORPHONE HCL PF 1 MG/ML IJ SOLN
INTRAMUSCULAR | Status: AC
  Filled 2014-01-20: qty 1

## 2014-01-20 MED ORDER — PROPOFOL 10 MG/ML IV BOLUS
INTRAVENOUS | Status: DC | PRN
  Administered 2014-01-20: 200 mg via INTRAVENOUS

## 2014-01-20 MED ORDER — NEOSTIGMINE METHYLSULFATE 1 MG/ML IJ SOLN
INTRAMUSCULAR | Status: AC
  Filled 2014-01-20: qty 1

## 2014-01-20 MED ORDER — BUPIVACAINE HCL (PF) 0.25 % IJ SOLN
INTRAMUSCULAR | Status: AC
  Filled 2014-01-20: qty 30

## 2014-01-20 MED ORDER — HEPARIN SODIUM (PORCINE) 5000 UNIT/ML IJ SOLN
INTRAMUSCULAR | Status: AC
  Filled 2014-01-20: qty 1

## 2014-01-20 MED ORDER — LACTATED RINGERS IV SOLN: INTRAVENOUS | Status: DC

## 2014-01-20 MED ORDER — HYDROCODONE-ACETAMINOPHEN 5-300 MG PO TABS: 1.0000 | ORAL_TABLET | Freq: Four times a day (QID) | ORAL | Status: DC | PRN

## 2014-01-20 MED ORDER — PROPOFOL 10 MG/ML IV EMUL
INTRAVENOUS | Status: AC
  Filled 2014-01-20: qty 20

## 2014-01-20 MED ORDER — LACTATED RINGERS IR SOLN
Status: DC | PRN
  Administered 2014-01-20: 3000 mL

## 2014-01-20 MED ORDER — FENTANYL CITRATE 0.05 MG/ML IJ SOLN
INTRAMUSCULAR | Status: DC | PRN
  Administered 2014-01-20: 25 ug via INTRAVENOUS
  Administered 2014-01-20 (×3): 50 ug via INTRAVENOUS
  Administered 2014-01-20: 25 ug via INTRAVENOUS
  Administered 2014-01-20: 50 ug via INTRAVENOUS

## 2014-01-20 MED ORDER — DOXYCYCLINE HYCLATE 50 MG PO CAPS: 50.0000 mg | ORAL_CAPSULE | Freq: Two times a day (BID) | ORAL | Status: DC

## 2014-01-20 MED ORDER — BUPIVACAINE HCL (PF) 0.25 % IJ SOLN
INTRAMUSCULAR | Status: DC | PRN
  Administered 2014-01-20: 22 mL

## 2014-01-20 MED ORDER — GLYCINE 1.5 % IR SOLN
Status: DC | PRN
  Administered 2014-01-20: 3000 mL

## 2014-01-20 MED ORDER — GLYCOPYRROLATE 0.2 MG/ML IJ SOLN
INTRAMUSCULAR | Status: AC
  Filled 2014-01-20: qty 3

## 2014-01-20 MED ORDER — ROCURONIUM BROMIDE 100 MG/10ML IV SOLN
INTRAVENOUS | Status: DC | PRN
  Administered 2014-01-20: 35 mg via INTRAVENOUS
  Administered 2014-01-20: 15 mg via INTRAVENOUS

## 2014-01-20 SURGICAL SUPPLY — 84 items
ADH SKN CLS APL DERMABOND .7 (GAUZE/BANDAGES/DRESSINGS) ×2
APPLICATOR COTTON TIP 6IN STRL (MISCELLANEOUS) ×3 IMPLANT
BAG URINE DRAINAGE (UROLOGICAL SUPPLIES) ×3 IMPLANT
BARRIER ADHS 3X4 INTERCEED (GAUZE/BANDAGES/DRESSINGS) ×5 IMPLANT
BRR ADH 4X3 ABS CNTRL BYND (GAUZE/BANDAGES/DRESSINGS) ×6
CABLE HIGH FREQUENCY MONO STRZ (ELECTRODE) ×1 IMPLANT
CANISTER SUCT 3000ML (MISCELLANEOUS) ×3 IMPLANT
CATH FOLEY 3WAY  5CC 16FR (CATHETERS) ×1
CATH FOLEY 3WAY 5CC 16FR (CATHETERS) ×2 IMPLANT
CATH ROBINSON RED A/P 16FR (CATHETERS) ×3 IMPLANT
CHLORAPREP W/TINT 26ML (MISCELLANEOUS) ×3 IMPLANT
CLOTH BEACON ORANGE TIMEOUT ST (SAFETY) ×3 IMPLANT
CONT PATH 16OZ SNAP LID 3702 (MISCELLANEOUS) ×3 IMPLANT
CONTAINER PREFILL 10% NBF 60ML (FORM) ×6 IMPLANT
COVER MAYO STAND STRL (DRAPES) ×3 IMPLANT
COVER TABLE BACK 60X90 (DRAPES) ×6 IMPLANT
COVER TIP SHEARS 8 DVNC (MISCELLANEOUS) ×2 IMPLANT
COVER TIP SHEARS 8MM DA VINCI (MISCELLANEOUS) ×1
DECANTER SPIKE VIAL GLASS SM (MISCELLANEOUS) ×3 IMPLANT
DERMABOND ADVANCED (GAUZE/BANDAGES/DRESSINGS) ×1
DERMABOND ADVANCED .7 DNX12 (GAUZE/BANDAGES/DRESSINGS) ×2 IMPLANT
DEVICE TROCAR PUNCTURE CLOSURE (ENDOMECHANICALS) IMPLANT
DRAPE HUG U DISPOSABLE (DRAPE) ×3 IMPLANT
DRAPE HYSTEROSCOPY (DRAPE) ×3 IMPLANT
DRAPE LG THREE QUARTER DISP (DRAPES) ×6 IMPLANT
DRAPE WARM FLUID 44X44 (DRAPE) ×3 IMPLANT
DRSG TELFA 3X8 NADH (GAUZE/BANDAGES/DRESSINGS) ×3 IMPLANT
ELECT LIGASURE LONG (ELECTRODE) IMPLANT
ELECT REM PT RETURN 9FT ADLT (ELECTROSURGICAL) ×3
ELECTRODE REM PT RTRN 9FT ADLT (ELECTROSURGICAL) ×2 IMPLANT
EVACUATOR SMOKE 8.L (FILTER) ×3 IMPLANT
FORCEPS CUTTING 33CM 5MM (CUTTING FORCEPS) IMPLANT
FORCEPS CUTTING 45CM 5MM (CUTTING FORCEPS) IMPLANT
GAUZE VASELINE 3X9 (GAUZE/BANDAGES/DRESSINGS) IMPLANT
GLOVE BIOGEL PI IND STRL 7.0 (GLOVE) ×4 IMPLANT
GLOVE BIOGEL PI INDICATOR 7.0 (GLOVE) ×2
GLOVE ECLIPSE 6.5 STRL STRAW (GLOVE) ×3 IMPLANT
GOWN STRL REUS W/TWL LRG LVL3 (GOWN DISPOSABLE) ×18 IMPLANT
KIT ACCESSORY DA VINCI DISP (KITS) ×1
KIT ACCESSORY DVNC DISP (KITS) ×2 IMPLANT
LEGGING LITHOTOMY PAIR STRL (DRAPES) ×3 IMPLANT
LOOP ANGLED CUTTING 22FR (CUTTING LOOP) ×1 IMPLANT
NDL SPNL 22GX3.5 QUINCKE BK (NEEDLE) ×2 IMPLANT
NEEDLE INSUFFLATION 120MM (ENDOMECHANICALS) ×3 IMPLANT
NEEDLE SPNL 22GX3.5 QUINCKE BK (NEEDLE) ×3 IMPLANT
NS IRRIG 1000ML POUR BTL (IV SOLUTION) ×9 IMPLANT
OCCLUDER COLPOPNEUMO (BALLOONS) IMPLANT
PACK LAPAROSCOPY BASIN (CUSTOM PROCEDURE TRAY) ×3 IMPLANT
PACK LAVH (CUSTOM PROCEDURE TRAY) ×3 IMPLANT
PACK VAGINAL MINOR WOMEN LF (CUSTOM PROCEDURE TRAY) ×3 IMPLANT
PAD DRESSING TELFA 3X8 NADH (GAUZE/BANDAGES/DRESSINGS) ×2 IMPLANT
PAD OB MATERNITY 4.3X12.25 (PERSONAL CARE ITEMS) ×3 IMPLANT
PAD PREP 24X48 CUFFED NSTRL (MISCELLANEOUS) ×6 IMPLANT
PLUG CATH AND CAP STER (CATHETERS) ×3 IMPLANT
PROTECTOR NERVE ULNAR (MISCELLANEOUS) ×6 IMPLANT
SCISSORS LAP 5X35 DISP (ENDOMECHANICALS) IMPLANT
SET CYSTO W/LG BORE CLAMP LF (SET/KITS/TRAYS/PACK) IMPLANT
SET IRRIG TUBING LAPAROSCOPIC (IRRIGATION / IRRIGATOR) ×3 IMPLANT
SET TUBING HYSTEROSCOPY 2 NDL (TUBING) IMPLANT
SOLUTION ELECTROLUBE (MISCELLANEOUS) ×3 IMPLANT
SUT VIC AB 0 CT1 27 (SUTURE) ×15
SUT VIC AB 0 CT1 27XBRD ANTBC (SUTURE) ×10 IMPLANT
SUT VICRYL 0 UR6 27IN ABS (SUTURE) ×3 IMPLANT
SUT VICRYL 4-0 PS2 18IN ABS (SUTURE) ×6 IMPLANT
SYR 50ML LL SCALE MARK (SYRINGE) ×3 IMPLANT
SYR CONTROL 10ML LL (SYRINGE) ×3 IMPLANT
SYSTEM CONVERTIBLE TROCAR (TROCAR) IMPLANT
TIP UTERINE 5.1X6CM LAV DISP (MISCELLANEOUS) IMPLANT
TIP UTERINE 6.7X10CM GRN DISP (MISCELLANEOUS) IMPLANT
TIP UTERINE 6.7X6CM WHT DISP (MISCELLANEOUS) IMPLANT
TIP UTERINE 6.7X8CM BLUE DISP (MISCELLANEOUS) ×1 IMPLANT
TOWEL OR 17X24 6PK STRL BLUE (TOWEL DISPOSABLE) ×9 IMPLANT
TROCAR 12M 150ML BLUNT (TROCAR) IMPLANT
TROCAR BALLN 12MMX100 BLUNT (TROCAR) IMPLANT
TROCAR DISP BLADELESS 8 DVNC (TROCAR) IMPLANT
TROCAR DISP BLADELESS 8MM (TROCAR)
TROCAR OPTI TIP 12M 100M (ENDOMECHANICALS) ×3 IMPLANT
TROCAR OPTI TIP 5M 100M (ENDOMECHANICALS) ×6 IMPLANT
TROCAR XCEL DIL TIP R 11M (ENDOMECHANICALS) ×3 IMPLANT
TROCAR Z-THREAD 12X150 (TROCAR) ×3 IMPLANT
TUBE HYSTEROSCOPY W Y-CONNECT (TUBING) IMPLANT
TUBING FILTER THERMOFLATOR (ELECTROSURGICAL) ×3 IMPLANT
WARMER LAPAROSCOPE (MISCELLANEOUS) ×3 IMPLANT
WATER STERILE IRR 1000ML POUR (IV SOLUTION) ×3 IMPLANT

## 2014-01-20 NOTE — Discharge Instructions (Addendum)
Warm heat to abdomen every 4 hrs x 24 hrs. Ambulate  DISCHARGE INSTRUCTIONS: Laparoscopy  The following instructions have been prepared to help you care for yourself upon your return home today.  Wound care:  Do not get the incision wet for the first 24 hours. The incision should be kept clean and dry.  The Band-Aids or dressings may be removed the day after surgery.  Should the incision become sore, red, and swollen after the first week, check with your doctor.  Personal hygiene:  Shower the day after your procedure.  Activity and limitations:  Do NOT drive or operate any equipment today.  Do NOT lift anything more than 15 pounds for 2-3 weeks after surgery.  Do NOT rest in bed all day.  Walking is encouraged. Walk each day, starting slowly with 5-minute walks 3 or 4 times a day. Slowly increase the length of your walks.  Walk up and down stairs slowly.  Do NOT do strenuous activities, such as golfing, playing tennis, bowling, running, biking, weight lifting, gardening, mowing, or vacuuming for 2-4 weeks. Ask your doctor when it is okay to start.  Diet: Eat a light meal as desired this evening. You may resume your usual diet tomorrow.  Return to work: This is dependent on the type of work you do. For the most part you can return to a desk job within a week of surgery. If you are more active at work, please discuss this with your doctor.  What to expect after your surgery: You may have a slight burning sensation when you urinate on the first day. You may have a very small amount of blood in the urine. Expect to have a small amount of vaginal discharge/light bleeding for 1-2 weeks. It is not unusual to have abdominal soreness and bruising for up to 2 weeks. You may be tired and need more rest for about 1 week. You may experience shoulder pain for 24-72 hours. Lying flat in bed may relieve it.  Call your doctor for any of the following:  Develop a fever of 100.4 or greater   Inability to urinate 6 hours after discharge from hospital  Severe pain not relieved by pain medications  Persistent of heavy bleeding at incision site  Redness or swelling around incision site after a week  Increasing nausea or vomiting  Patient Signature________________________________________ Nurse Signature_________________________________________      DO NOT TAKE ANY IBUPROFEN PRODUCTS UNTIL 9 PM TONIGHT

## 2014-01-20 NOTE — H&P (Signed)
Christine Fitzgerald is an 38 y.o. female. G1P1 BF presents for surgical management of pelvic pain and endometrial masses noted on sonohysterogram  Pertinent Gynecological History: Menses: heavy Bleeding: heavy Contraception: OCP (estrogen/progesterone) DES exposure: denies Blood transfusions: none Sexually transmitted diseases: no past history Previous GYN Procedures: none  Last mammogram: n/a Date: n/a Last pap: normal Date: 11/2013 OB History: G1, P1   Menstrual History: Menarche JWJ:XBJYage:none No LMP recorded.    Past Medical History  Diagnosis Date  . Allergy     salmon  . Thyroid disease   . Hypothyroidism   . History of kidney stones 04/2012    ureteral    History reviewed. No pertinent past surgical history.  Family History  Problem Relation Age of Onset  . Diabetes Mother   . Hypertension Mother   . Hyperlipidemia Mother   . Glaucoma Mother   . Prostatitis Father     Social History:  reports that she has never smoked. She does not have any smokeless tobacco history on file. She reports that she does not use illicit drugs. Her alcohol history is not on file.  Allergies:  Allergies  Allergen Reactions  . Salmon [Fish Allergy] Swelling    Swelling of mouth    Prescriptions prior to admission  Medication Sig Dispense Refill  . FLUoxetine (PROZAC) 10 MG tablet Take 10 mg by mouth daily. Hasn't started taking yet, will start after surgery      . norgestimate-ethinyl estradiol (ORTHO-CYCLEN,SPRINTEC,PREVIFEM) 0.25-35 MG-MCG tablet Take 1 tablet by mouth daily.      . Multiple Vitamins-Minerals (HAIR/SKIN/NAILS PO) Take 1 tablet by mouth daily.        Review of Systems  All other systems reviewed and are negative.   Blood pressure 123/101, pulse 90, temperature 98.4 F (36.9 C), temperature source Oral, resp. rate 18, SpO2 99.00%. Physical Exam  Constitutional: She is oriented to person, place, and time. She appears well-developed and well-nourished.  HENT:  Head:  Normocephalic.  Neck: Neck supple.  Respiratory: Breath sounds normal.  GI: Soft.  Musculoskeletal: She exhibits no edema.  Neurological: She is alert and oriented to person, place, and time.  Skin: Skin is warm and dry.  Psychiatric: She has a normal mood and affect.    Results for orders placed during the hospital encounter of 01/20/14 (from the past 24 hour(s))  CBC     Status: None   Collection Time    01/20/14 11:37 AM      Result Value Ref Range   WBC 5.8  4.0 - 10.5 K/uL   RBC 4.66  3.87 - 5.11 MIL/uL   Hemoglobin 13.8  12.0 - 15.0 g/dL   HCT 78.240.2  95.636.0 - 21.346.0 %   MCV 86.3  78.0 - 100.0 fL   MCH 29.6  26.0 - 34.0 pg   MCHC 34.3  30.0 - 36.0 g/dL   RDW 08.612.2  57.811.5 - 46.915.5 %   Platelets 376  150 - 400 K/uL    No results found.  Assessment/Plan: Menorrhagia Dysmenorrhea despite OCP Endometrial mass] P) dx hysteroscopy, D&C, resection of endom masses. Dx laparoscopy, poss davinci robotic resection of endometriosis if present. Risk of surgery reviewed including infection, bleeding, uterine perforation and its risk, thermal injury, injury to surrounding organ structures, fluid overload. Consent signed  Alexandre Lightsey A Allisen Pidgeon 01/20/2014, 12:59 PM

## 2014-01-20 NOTE — Transfer of Care (Signed)
Immediate Anesthesia Transfer of Care Note  Patient: Christine [name]  Procedure(s) Performed: Procedure(s) with comments: DILATATION & CURETTAGE/HYSTEROSCOPY WITH RESECTOCOPE (N/A) - 2 1/2 hrs. total LAPAROSCOPY DIAGNOSTIC (N/A)  ROBOTIC ASSISTED LAPAROSCOPIC EXTENSIVE  LYSIS OF ADHESION;  BILATERAL SALPINGECTOMY (N/A)  Patient Location: PACU  Anesthesia Type:General  Level of Consciousness: awake  Airway & Oxygen Therapy: Patient Spontanous Breathing  Post-op Assessment: Report given to PACU RN  Post vital signs: stable  Filed Vitals:   01/20/14 1212  BP: 123/101  Pulse: 90  Temp: 36.9 C  Resp: 18    Complications: No apparent anesthesia complications

## 2014-01-20 NOTE — Brief Op Note (Signed)
01/20/2014  3:36 PM  PATIENT:  Christine Fitzgerald  38 y.o. female  PRE-OPERATIVE DIAGNOSIS:  Dysmenorrhea, Endometrial Mass, Menorrhagia  POST-OPERATIVE DIAGNOSIS:  dysmenorrhea, endometrial polyps Menorrhagia, severe pelvic adhesions, hydrosalpinges  PROCEDURE:  Diagnostic hysteroscopy, hysteroscopic resection of endometrial polyps, dilation and curettage, da Vinci robotic laparoscopic resection of severe pelvic adhesions, bilateral salpingectomy  SURGEON:  Surgeon(s) and Role:    * Daruis Swaim A Jelicia Nantz, MD - Primary  PHYSICIAN ASSISTANT:   ASSISTANTS: Marlinda Mikeanya Bailey, CNM   ANESTHESIA:   general  FINDINGS; uterus with small SS fibroid, extensive adhesions of posterior cul de sac, peritubal and periovarian adhesions, bilateral hydrosalpinges, nl appendix, nl liver edge  EBL:  Total I/O In: 1500 [I.V.:1500] Out: 325 [Urine:300; Blood:25]  BLOOD ADMINISTERED:none  DRAINS: none   LOCAL MEDICATIONS USED:  MARCAINE    and BUPIVICAINE   SPECIMEN:  Source of Specimen:  fallopian tubes, emc w/ endometrial polyps  DISPOSITION OF SPECIMEN:  PATHOLOGY  COUNTS:  YES  TOURNIQUET:  * No tourniquets in log *  DICTATION: .Other Dictation: Dictation Number [id number]  PLAN OF CARE: Discharge to home after PACU  PATIENT DISPOSITION:  PACU - hemodynamically stable.   Delay start of Pharmacological VTE agent (>24hrs) due to surgical blood loss or risk of bleeding: no

## 2014-01-20 NOTE — Anesthesia Postprocedure Evaluation (Signed)
  Anesthesia Post-op Note  Patient: Christine DivineMarcia Hoshino  Procedure(s) Performed: Procedure(s) with comments: DILATATION & CURETTAGE/HYSTEROSCOPY WITH RESECTOCOPE (N/A) - 2 1/2 hrs. total LAPAROSCOPY DIAGNOSTIC (N/A)  ROBOTIC ASSISTED LAPAROSCOPIC EXTENSIVE  LYSIS OF ADHESION;  BILATERAL SALPINGECTOMY (N/A)  Patient Location: PACU  Anesthesia Type:General  Level of Consciousness: awake, alert  and sedated  Airway and Oxygen Therapy: Patient Spontanous Breathing  Post-op Pain: mild  Post-op Assessment: Post-op Vital signs reviewed, Patient's Cardiovascular Status Stable, Respiratory Function Stable, Patent Airway and No signs of Nausea or vomiting  Post-op Vital Signs: Reviewed and stable  Last Vitals:  Filed Vitals:   01/20/14 1630  BP: 103/63  Pulse: 78  Temp: 36.8 C  Resp: 14    Complications: No apparent anesthesia complications

## 2014-01-20 NOTE — Anesthesia Preprocedure Evaluation (Signed)

## 2014-01-21 ENCOUNTER — Ambulatory Visit (HOSPITAL_COMMUNITY): Payer: BC Managed Care – PPO

## 2014-01-21 ENCOUNTER — Encounter (HOSPITAL_COMMUNITY): Payer: Self-pay | Admitting: Obstetrics and Gynecology

## 2014-01-21 MED FILL — Heparin Sodium (Porcine) Inj 5000 Unit/ML: INTRAMUSCULAR | Qty: 1 | Status: AC

## 2014-01-21 NOTE — Op Note (Signed)
Christine Fitzgerald, Christine Fitzgerald                  ACCOUNT NO.:  [id number]  MEDICAL RECORD NO.:  [id number]  LOCATION:  [facility]                          FACILITY:  [facility]  PHYSICIAN:  Maxie Better, M.D.DATE OF BIRTH:  [birth year]  DATE OF PROCEDURE:  01/20/2014 DATE OF DISCHARGE:  01/20/2014                              OPERATIVE REPORT   PREOPERATIVE DIAGNOSES:  Menorrhagia, endometrial mass, dysmenorrhea/pelvic pain.  PROCEDURES:  Diagnostic hysteroscopy, hysteroscopic resection of endometrial polyps, dilation and curettage, da Vinci robotic laparoscopic extensive lysis of adhesions, bilateral salpingectomy.  POSTOPERATIVE DIAGNOSES:  Endometrial polyps, menorrhagia, dysmenorrhea/pelvic pain, severe pelvic adhesions, hydrosalpinges.  ANESTHESIA:  General.  SURGEON:  Maxie Better, M.D.  ASSISTANT:  Marlinda Mike, C.N.M.  DESCRIPTION OF PROCEDURE:  Under adequate general anesthesia, the patient was positioned for robotic surgery in the dorsal lithotomy position.  Examination under anesthesia had revealed anteverted uterus. No adnexal masses could be appreciated.  The patient was sterilely prepped and draped in usual fashion.  A 3-way Foley catheter was sterilely placed.  Bivalve speculum was placed in the vagina.  Single- tooth tenaculum was placed on the anterior lip of the cervix.  The cervix was then serially dilated up to #31 [facility] dilator.  The patient was on her cycle.  The resectoscope was introduced into the uterine cavity, polypoid lesion was noted at the lower uterine segment.  The fluffy endometrium was noted, both tubal ostia could be seen.  The resectoscope was used to resect the polyp.  The resectoscope was removed.  The cavity was then gently curetted and the procedure was then completed.  Uterus was sounded to 9 cm.  The uterine manipulator #8 size with the RUMI manipulator was attached and placed in the uterine cavity for manipulation of the uterus.  Bivalve speculum  was then removed. Attention was then turned to the abdomen.  A 0.25% Marcaine was injected and infraumbilical vertical incision was then made.  Veress needle was introduced, opening pressure of 2 was noted.  A 3 liters of CO2 was insufflated.  Veress needle was then removed.  A 12-mm disposable trocar with sleeve was introduced into the abdomen without incident.  The robotic camera was then placed through that port.  On inspection, there was no trauma to the abdomen on entry.  The upper abdomen showed normal liver edge and gallbladder.  The patient was then placed in Trendelenburg position.  The appendix was noted to be normal.  The uterus after manipulation was noted to have one subserosal right posterior fibroid, but the posterior cul-de-sac was obliterated with adhesions throughout. The right distal tube, had no fimbriated end and the left tube was encased with adhesions as well.  At that point, decision was then made to do a robotic approach, 0.25% Marcaine was injected in the lower quadrants on the right and left side, and small incision made and the robotic 8-mm ports were then placed under direct visualization in these location and in the right mid quadrant, a 5-mm assistant port was placed.  The robot was then docked to the patient's left side.  In Arm #1, the monopolar scissor was placed.  In arm #2, the PK dissector was placed.  A 60 mL of bupivacaine was instilled in the abdomen.  I then went to the surgical console.  At the surgical console, the uterus with manipulation showed the posterior pelvis much more fully and inspection confirmed what was already seen. Several layers of adhesions was noted, obliterating the posterior cul-de-sac and encasing the left greater than right ovary(adnexa).  The appendix was normal.  The right tube was tortuous with the distal end not opened.  The left tube had paratubal and distal adhesions.  The procedure was started on the right side where  all the adhesions was subsequently resected, carried all the way down to the posterior cul-de-sac and carefully lysed off of the rectosigmoid.  The areas over the bowel was resected with cold dissection.  This was carried all the way to the opposite side, freeing up the left ovary as well and finally identify the ureters bilaterally. Once all the adhesions were removed and the anatomy restored posteriorly, and the anterior cul-de-sac was inspected and no endometriosis was noted.  The left pelvic side wall adhesions were removed.  The reflection of the colon on the left, adhesions were removed as well. Given the findings of the tubes, decision was then made to remove both fallopian tubes.  Using the PK dissector, the mesosalpinx bilaterally was serially clamped, cauterized, and then cut until both fallopian tubes were removed.  They were subsequently placed in the posterior cul- de-sac.  The pelvis  was irrigated and suctioned.  Good hemostasis noted throughout.  Decision was then made to terminate the procedure. At that point, the robotic instruments were removed.  The robot was undocked.  I then went back to the patient's bedsides sterilely and using the side ports, the fallopian tubes x2 was retrieved.  Bupivacaine was then reinstilled in the abdomen.  The lower port site was removed under direct visualization and the infraumbilical site was then removed. The incisions were then once again injected with 0.25% Marcaine and the incisions were closed with 4-0 Vicryl subcuticular stitch and 0 Vicryl fascial stitch at the infraumbilical site.  The instruments from the vagina was subsequently removed.  Interceed was placed over the both fallopian tubes and the posterior cul-de-sac.  The patient intraoperatively received doxycycline given the findings of these adhesions, in the event of possible old pelvic inflammatory disease.  The Foley catheter was removed prior to transferring the patient to  the recovery room.  SPECIMEN:  Endometrial curetting with polyps and both fallopian tubes sent to Pathology.  ESTIMATED BLOOD LOSS:  20 mL.  COMPLICATION:  None.  The patient tolerated the procedure well, was transferred to recovery room in stable condition.     Maxie BetterSheronette Dallen Bunte, M.D.     [place]/MEDQ  D:  01/20/2014  T:  01/21/2014  Job:  [id number]

## 2014-01-25 NOTE — Telephone Encounter (Signed)
Patient never returned call  

## 2014-02-02 ENCOUNTER — Encounter: Payer: Self-pay | Admitting: Family Medicine

## 2014-02-16 ENCOUNTER — Encounter: Payer: Self-pay | Admitting: Family Medicine

## 2014-02-16 ENCOUNTER — Encounter (INDEPENDENT_AMBULATORY_CARE_PROVIDER_SITE_OTHER): Payer: Self-pay

## 2014-02-16 ENCOUNTER — Ambulatory Visit (INDEPENDENT_AMBULATORY_CARE_PROVIDER_SITE_OTHER): Payer: BC Managed Care – PPO | Admitting: Family Medicine

## 2014-02-16 VITALS — BP 122/80 | HR 98 | Resp 18 | Ht 64.5 in | Wt 177.0 lb

## 2014-02-16 DIAGNOSIS — E669 Obesity, unspecified: Secondary | ICD-10-CM

## 2014-02-16 DIAGNOSIS — F3289 Other specified depressive episodes: Secondary | ICD-10-CM

## 2014-02-16 DIAGNOSIS — R109 Unspecified abdominal pain: Secondary | ICD-10-CM

## 2014-02-16 DIAGNOSIS — R519 Headache, unspecified: Secondary | ICD-10-CM

## 2014-02-16 DIAGNOSIS — F329 Major depressive disorder, single episode, unspecified: Secondary | ICD-10-CM

## 2014-02-16 DIAGNOSIS — R51 Headache: Secondary | ICD-10-CM

## 2014-02-16 DIAGNOSIS — F32A Depression, unspecified: Secondary | ICD-10-CM

## 2014-02-16 NOTE — Patient Instructions (Addendum)
F/u in 5 weeks, call if you need me before  yoou need to start taking fluoxetine and are referred to therapist and psychiatry for help  Work excuse will be provided For 4 weeks starting next Monday

## 2014-02-16 NOTE — Progress Notes (Signed)
Subjective:    Patient ID: Christine Fitzgerald, female    DOB: [birth year], 38 y.o.   MRN: [id number]  HPI Pt in today stating that she is continuing to have abdominal pain post endometrial ablation which she had on April 9, states she was returned to work with no restriction on 02/01/2014 on the day that she saw her gyne for f/u. States she still had a lot of pain then, still reports being unable to lie on her stomach, and still c/o infraumbilical pain intermittent daily, every day it is a 4 u-p to an 8 daily stops her in her tracks, most times 3 times per day up to an 8 Next day called work to say unable to return was advised to stay out until 4/27, went in half day 4/27 and 4/28 , and by 4/29 and just returne d 05/4, working part days as of now. While patient was in the office , a direct call to  The gyne who did her procedure was made, dr Cherly Hensenousins, I explained that pt was c/o intermittent disabling pain post procedure, there is no h/o vaginal drainage, fever or chills, and she was requesting additional time out of work as a result. Dr Cherly Hensenousins said that there was no justification in keeping the patient out of work based on pain from the procedure that she  Recently had. I conveyed this to Christine Fitzgerald, and asked if she wanted a 2nd gyne opinion if that were even an option , she was not too keen on this, though she had originally stated that as afar as Dr Cherly Hensenousins was concerned she had only praise for the surgical work that was done, but was"not happy with her bedside manner" Christine Fitzgerald has not actually started the fluoxetine prescribed at her last visit for depression, her only interaction with a therapist to this time was "not very good" and she called in stating she was trying to locate another therapist prior to this visit. She had also called in about unrelenting headaches, was referred to the headache clinic and did not keep appointment. I have advised she reschedule that appt, and that I will help her to locate a  therapist and she needs to start the fluoxetine. I will take her out of work for 4 weeks with the clear understanding that during this time she has her mental health, stress, and coping issues addressed by mental health professionals along with any other somatic complaints that she has , as in the headache, she understands and agrees on this plan    Review of Systems See HPI Denies recent fever or chills.c/o chronic fatigue uncontrolled intermittent abdominal pain and poor sleep Denies sinus pressure, nasal congestion, ear pain or sore throat. Denies chest congestion, productive cough or wheezing. Denies chest pains, palpitations and leg swelling Denies  nausea, vomiting,diarrhea or constipation.   Denies dysuria, frequency, hesitancy or incontinence. Denies joint pain, swelling and limitation in mobility.no more significant back pain, was reassured by [facility] report and no stated intent on following this up anymore. Denies  seizures, numbness, or tingling.  Denies skin break down or rash.        Objective:   Physical Exam BP 122/80  Pulse 98  Resp 18  Ht 5' 4.5" (1.638 m)  Wt 177 lb 0.6 oz (80.305 kg)  BMI 29.93 kg/m2  SpO2 100% Patient alert and oriented and in no cardiopulmonary distress.Intermittent episodes of bending over due to  Cramping abdominal pain  HEENT: No facial asymmetry,  EOMI, no sinus tenderness,  oropharynx pink and moist.  Neck supple no adenopathy.  Chest: Clear to auscultation bilaterally.  CVS: S1, S2 no murmurs, no S3.  ABD: Soft no guarding or rebound tenderness, bowel sounds normal.   Ext: No edema  Christine: Adequate ROM spine, shoulders, hips and knees.  Skin: Intact, no ulcerations or rash noted.  Psych: Good eye contact, normal affect. Memory intact not anxious or depressed appearing.  CNS: CN 2-12 intact, power, tone and sensation normal throughout.        Assessment & Plan:  Depression Pt to start fluoxetine daily, and she is referred  to therapist and psychiatrist for assistance with care. She will be taken out of wor for 1 month,until her mental health improves, of note , she is neither suicidal or homicidal, but definitely requires stabilization of her mental health before she can function appropriately on her job  Obesity (BMI 30.0-34.9) Unchanged Patient re-educated about  the importance of commitment to a  minimum of 150 minutes of exercise per week. The importance of healthy food choices with portion control discussed. Encouraged to start a food diary, count calories and to consider  joining a support group. Sample diet sheets offered. Goals set by the patient for the next several months.     Headache disorder Still c/o uncontrolled headaches, needs to go to neurologist, reminded her of need pot follow through on referrals, states she will go to see specialist, as symptom persists  Abdominal pain ongoing c/o abdominal pain, colicky, following gyne surgery, denies change in BM, will continue to monitor. I did speak with the gyne Doc during the visit, she had no reason to believe that the pain pt was experiencing was related to recent surgery, and maintained that from her standpoint pt should n be able to work

## 2014-02-17 ENCOUNTER — Telehealth (HOSPITAL_COMMUNITY): Payer: Self-pay | Admitting: *Deleted

## 2014-02-21 NOTE — Assessment & Plan Note (Signed)
Pt to start fluoxetine daily, and she is referred to therapist and psychiatrist for assistance with care. She will be taken out of wor for 1 month,until her mental health improves, of note , she is neither suicidal or homicidal, but definitely requires stabilization of her mental health before she can function appropriately on her job

## 2014-02-25 ENCOUNTER — Telehealth: Payer: Self-pay | Admitting: Family Medicine

## 2014-02-25 NOTE — Telephone Encounter (Signed)
Noted, appreciate

## 2014-03-04 ENCOUNTER — Encounter (HOSPITAL_COMMUNITY): Payer: Self-pay | Admitting: Psychiatry

## 2014-03-04 ENCOUNTER — Ambulatory Visit (INDEPENDENT_AMBULATORY_CARE_PROVIDER_SITE_OTHER): Payer: BC Managed Care – PPO | Admitting: Psychiatry

## 2014-03-04 VITALS — BP 120/78 | Ht 64.0 in | Wt 177.0 lb

## 2014-03-04 DIAGNOSIS — F32A Depression, unspecified: Secondary | ICD-10-CM

## 2014-03-04 DIAGNOSIS — F329 Major depressive disorder, single episode, unspecified: Secondary | ICD-10-CM

## 2014-03-04 MED ORDER — FLUOXETINE HCL 20 MG PO CAPS: 20.0000 mg | ORAL_CAPSULE | Freq: Every day | ORAL | Status: DC

## 2014-03-04 NOTE — Progress Notes (Signed)
Psychiatric Assessment Adult  Patient Identification:  Christine Fitzgerald Date of Evaluation:  03/04/2014 Chief Complaint: "My job has been overwhelming." History of Chief Complaint:   Chief Complaint  Patient presents with  . Depression  . Establish Care    HPI this patient is a 38 year old separated black female who lives with her mother and 62 year old daughter in [place]. She works as a Dance movement psychotherapist for [employer].  The patient was referred by Dr. Syliva Overman, her primary care physician for further treatment and assessment of depression.  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 In 2012 she was transferred from a team of 6 people that she worked with to a new position in which she had responsibility for many departments. She was overwhelmed with work and often stayed till 10 or 11 at night. She was supposed to get an Geophysicist/field seismologist but status supervisor was tired and she claims a supervisor doesn't know how to do the job. She tried to train an Geophysicist/field seismologist and the person couldn't seem to learn to tasks necessary. The patient had become increasingly overwhelmed and stressed. She had stomachaches and headaches every time she came to work. She became increasingly depressed with low energy and low mood. She started spending most of her weekend time in bed. She's never been suicidal but feels very frustrated with her current situation. She does not use alcohol or drugs and is ever had psychotic symptoms or any previous psychiatric treatment.  On April 9 the patient had a D&C. She had a lot of pain for several weeks afterwards and has been out of work on medical leave. She tried to go back but was overwhelmed with the job again. She has to go back on June 9 and has reconciled herself to this idea but is going to try to either make some changes at work or find another job. About 2 weeks ago Dr. Shon Hale started her on Prozac 10 mg and she's starting to feel a little  better but this might be due to the fact that she's at home and doesn't have to go to work. I told her she is going to be on Prozac it would make more sense to use the 20 mg dose for in and out and she agrees. She has been seeing a therapist in [place] for several weeks and would like to continue this. Review of Systems  Constitutional: Positive for activity change.  Eyes: Negative.   Respiratory: Negative.   Cardiovascular: Negative.   Gastrointestinal: Positive for abdominal pain.  Endocrine: Negative.   Genitourinary: Positive for pelvic pain.  Musculoskeletal: Negative.   Allergic/Immunologic: Negative.   Neurological: Positive for headaches.  Hematological: Negative.   Psychiatric/Behavioral: Positive for sleep disturbance and dysphoric mood.   Physical Exam not done Depressive Symptoms: depressed mood, anhedonia, psychomotor retardation, fatigue, hopelessness,  (Hypo) Manic Symptoms:   Elevated Mood:  No Irritable Mood:  No Grandiosity:  No Distractibility:  No Labiality of Mood:  No Delusions:  No Hallucinations:  No Impulsivity:  No Sexually Inappropriate Behavior:  No Financial Extravagance:  No Flight of Ideas:  No  Anxiety Symptoms: Excessive Worry:  Yes Panic Symptoms:  No Agoraphobia:  No Obsessive Compulsive: No  Symptoms: None, Specific Phobias:  No Social Anxiety:  No  Psychotic Symptoms:  Hallucinations: No None Delusions:  No Paranoia:  No   Ideas of Reference:  No  PTSD Symptoms: Ever had a traumatic exposure:  No Had a traumatic  exposure in the last month:  No Re-experiencing: No None Hypervigilance:  No Hyperarousal: No None Avoidance: No None  Traumatic Brain Injury: No   Past Psychiatric History: Diagnosis: Depression   Hospitalizations: none  Outpatient Care: [place] in [place] for [place] weeks   Substance Abuse Care: none  Self-Mutilation: none  Suicidal Attempts: none  Violent Behaviors: none    Past Medical History:   Past Medical History  Diagnosis Date  . Allergy     salmon  . Thyroid disease   . Hypothyroidism   . History of kidney stones 04/2012    ureteral   History of Loss of Consciousness:  No Seizure History:  No Cardiac History:  No Allergies:   Allergies  Allergen Reactions  . Salmon [Fish Allergy] Swelling    Swelling of mouth   Current Medications:  Current Outpatient Prescriptions  Medication Sig Dispense Refill  . FLUoxetine (PROZAC) 20 MG capsule Take 1 capsule (20 mg total) by mouth daily.  30 capsule  2  . Hydrocodone-Acetaminophen 5-300 MG TABS Take 1 tablet by mouth every 6 (six) hours as needed.  30 each  0  . Multiple Vitamins-Minerals (HAIR/SKIN/NAILS PO) Take 1 tablet by mouth daily.      . norgestimate-ethinyl estradiol (ORTHO-CYCLEN,SPRINTEC,PREVIFEM) 0.25-35 MG-MCG tablet Take 1 tablet by mouth daily.       No current facility-administered medications for this visit.    Previous Psychotropic Medications:  Medication Dose   Prozac 10 mg daily                       Substance Abuse History in the last 12 months: Substance Age of 1st Use Last Use Amount Specific Type  Nicotine      Alcohol      Cannabis      Opiates      Cocaine      Methamphetamines      LSD      Ecstasy      Benzodiazepines      Caffeine      Inhalants      Others:                          Medical Consequences of Substance Abuse: n/a  Legal Consequences of Substance Abuse: n/a  Family Consequences of Substance Abuse: n/a  Blackouts:  No DT's:  No Withdrawal Symptoms:  No None  Social History: [place] [facility] [place] [address] [place] Place of Birth: [place] Family Members: Mother, 37 year old daughter, 6 siblings, father and [place] Marital Status:  Separated Children:   Sons:   Daughters: 37 y/o Relationships: Still sees her husband and they're trying to work out the marriage but he has had several  affairs Education:  [employer] Educational Problems/Performance:  Religious Beliefs/Practices: Christian History of Abuse: none Armed forces technical officerccupational Experiences; Environmental education officerhuman resources manager Military History:  None. Legal History: none Hobbies/Interests: Cleaning her house  Family History:   Family History  Problem Relation Age of Onset  . Diabetes Mother   . Hypertension Mother   . Hyperlipidemia Mother   . Glaucoma Mother   . Prostatitis Father     Mental Status Examination/Evaluation: Objective:  Appearance: Casual, Neat and Well Groomed  Eye Contact::  Good  Speech:  Clear and Coherent  Volume:  Normal  Mood:  Somewhat depressed   Affect:  Depressed  Thought Process:  Goal Directed  Orientation:  Full (Time,  Place, and Person)  Thought Content:  WDL  Suicidal Thoughts:  No  Homicidal Thoughts:  No  Judgement:  Good  Insight:  Good  Psychomotor Activity:  Normal  Akathisia:  No  Handed:  Right  AIMS (if indicated):    Assets:  Communication Skills Desire for Improvement Social Support Talents/Skills Vocational/Educational    Laboratory/X-Ray Psychological Evaluation(s)   Reviewed in the record      Assessment:  Axis I: Major Depression, single episode  AXIS I Major Depression, single episode  AXIS II Deferred  AXIS III Past Medical History  Diagnosis Date  . Allergy     salmon  . Thyroid disease   . Hypothyroidism   . History of kidney stones 04/2012    ureteral     AXIS IV occupational problems  AXIS V 61-70 mild symptoms   Treatment Plan/Recommendations:  Plan of Care: Medication management   Laboratory:    Psychotherapy: The patient states she would like to continue with her current counselor   Medications: Increase Prozac to 20 mg every morning   Routine PRN Medications:  No  Consultations:   Safety Concerns:  She has no thoughts of self-harm   Other: She'll return in 4 weeks     Diannia Ruder, MD 5/22/201510:33 AM

## 2014-03-09 ENCOUNTER — Ambulatory Visit (INDEPENDENT_AMBULATORY_CARE_PROVIDER_SITE_OTHER): Payer: BC Managed Care – PPO | Admitting: Psychology

## 2014-03-09 ENCOUNTER — Encounter (HOSPITAL_COMMUNITY): Payer: Self-pay | Admitting: Psychology

## 2014-03-09 DIAGNOSIS — F3289 Other specified depressive episodes: Secondary | ICD-10-CM

## 2014-03-09 DIAGNOSIS — F32A Depression, unspecified: Secondary | ICD-10-CM

## 2014-03-09 DIAGNOSIS — F329 Major depressive disorder, single episode, unspecified: Secondary | ICD-10-CM

## 2014-03-09 NOTE — Progress Notes (Signed)
    PROGRESS NOTE  Patient:  Christine Fitzgerald   DOB: 1976/05/29  MR Number: [id number]  Location: [facility] [address] [place] [address] Dept: [phone]  Start: 10 AM End: 11 AM  Provider/Observer:     Hershal Coria PSYD  Chief Complaint:      Chief Complaint  Patient presents with  . Depression  . Stress    Reason For Service:     The patient was referred by Dr. Tenny Craw after the patient's primary care Dr. Dr. Lodema Hong. The patient is a 38 year old female who's been dealing with depression and significant stress and feeling overwhelmed at work. The patient reports that she has had a lot going on the past couple of years. At first she did not identify what was happening but is now realized that depression is a major issue. The patient has been working at [employer]. for about 15 years and had been on the team of 6 when she was later moved to a position where she was essentially working on her own. While she was promised Mrs. that this never occurred. The patient began and significantly increasing her work hours point that she was working as many as 10 or more hours day. She developed a number of physical difficulties and medical issues that also were challenging to cope with. The patient describes moderate to significant symptoms of depression, anxiety, appetite changes, sleep disturbance, work stress, loss of interest, excessive worrying, low energy,. The patient reports that these have been much more significant the past 2 years.  Interventions Strategy:  Cognitive/behavioral psychotherapeutic interventions  Participation Level:   Active  Participation Quality:  Appropriate      Behavioral Observation:  Well Groomed, Alert, and Appropriate.   Current Psychosocial Factors: The patient reports that she will be back to work after a medical procedure was performed on June 6. However, she is expecting  him to a status to be very stressful as she is sure there is an excessive amount were called up. This is been historically true. We worked on some coping strategies to deal with this and some suggestions for the patient.  Content of Session:   Reviewed current symptoms and worked on coping skills to better manage and cope with significant stress and symptoms of depression including feeling overwhelmed, worrying, feeling helpless and hopeless, social isolation, sleep disturbance, and appetite changes.  Current Status:   The patient reports that she has been better while she was out of work for recovery from surgical intervention. However, as the time approaches for her return she is becoming more apprehensive and ill feelings are returning.  Patient Progress:   Stable  Target Goals:   Target goals include reducing the intensity, duration, and frequency of symptoms of depression and anxiety.  Last Reviewed:   03/09/2014  Goals Addressed Today:    Today we worked on building and establishing coping skills to better manage symptoms of depression and anxiety.  Impression/Diagnosis:   The patient is had a very stressful situation in her job over the past 2 years. Patient denies symptoms of depression prior to past 2 years but reports that these symptoms have been worsening over the it does appear that there is a major depressive issue in significant stress and anxiety primarily being driven by stress.  Diagnosis:    Axis I: Depression    RODENBOUGH,JOHN R, PsyD 03/09/2014

## 2014-03-30 ENCOUNTER — Ambulatory Visit (INDEPENDENT_AMBULATORY_CARE_PROVIDER_SITE_OTHER): Payer: BC Managed Care – PPO | Admitting: Psychology

## 2014-03-30 DIAGNOSIS — F32A Depression, unspecified: Secondary | ICD-10-CM

## 2014-03-30 DIAGNOSIS — F3289 Other specified depressive episodes: Secondary | ICD-10-CM

## 2014-03-30 DIAGNOSIS — F329 Major depressive disorder, single episode, unspecified: Secondary | ICD-10-CM

## 2014-04-01 ENCOUNTER — Ambulatory Visit (INDEPENDENT_AMBULATORY_CARE_PROVIDER_SITE_OTHER): Payer: BC Managed Care – PPO | Admitting: Psychiatry

## 2014-04-01 ENCOUNTER — Encounter (HOSPITAL_COMMUNITY): Payer: Self-pay | Admitting: Psychiatry

## 2014-04-01 VITALS — BP 110/74 | Ht 64.0 in | Wt 180.0 lb

## 2014-04-01 DIAGNOSIS — F32A Depression, unspecified: Secondary | ICD-10-CM

## 2014-04-01 DIAGNOSIS — F329 Major depressive disorder, single episode, unspecified: Secondary | ICD-10-CM

## 2014-04-01 NOTE — Progress Notes (Signed)
Patient ID: Christine Fitzgerald, female   DOB: 1976-09-08, 38 y.o.   MRN: [id number]  Psychiatric Assessment Adult  Patient Identification:  Christine Fitzgerald Date of Evaluation:  04/01/2014 Chief Complaint: "My job has been overwhelming." History of Chief Complaint:   Chief Complaint  Patient presents with  . Anxiety  . Depression  . Follow-up    Anxiety     this patient is a 38 year old separated black female who lives with her mother and 37 year old daughter in [place]. She works as a Dance movement psychotherapisthuman resources specialist for [employer]  The patient was referred by Dr. Syliva OvermanMargaret Simpson, her primary care physician for further treatment and assessment of depression.  The patient has been working for [employer] 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 In 2012 she was transferred from a team of 6 people that she worked with to a new position in which she had responsibility for many departments. She was overwhelmed with work and often stayed till 10 or 11 at night. She was supposed to get an Geophysicist/field seismologistassistant but status supervisor was tired and she claims a supervisor doesn't know how to do the job. She tried to train an Geophysicist/field seismologistassistant and the person couldn't seem to learn to tasks necessary. The patient had become increasingly overwhelmed and stressed. She had stomachaches and headaches every time she came to work. She became increasingly depressed with low energy and low mood. She started spending most of her weekend time in bed. She's never been suicidal but feels very frustrated with her current situation. She does not use alcohol or drugs and is ever had psychotic symptoms or any previous psychiatric treatment.  On April 9 the patient had a D&C. She had a lot of pain for several weeks afterwards and has been out of work on medical leave. She tried to go back but was overwhelmed with the job again. She has to go back on June 9 and has reconciled herself to this idea but is going to try to either make some changes at work or find another job.  About 2 weeks ago Dr. Shon HaleSimson started her on Prozac 10 mg and she's starting to feel a little better but this might be due to the fact that she's at home and doesn't have to go to work. I told her she is going to be on Prozac it would make more sense to use the 20 mg dose for in and out and she agrees. She has [place] therapist [place] for several weeks and would like to continue this.  The patient returns after four-week's. She is doing better. Her job situation has improved and her boss is being much more supportive and getting her help. She feels more relaxed she is sleeping better and is starting to get some exercise. She is spending more time with her daughter. Review of Systems  Constitutional: Positive for activity change.  Eyes: Negative.   Respiratory: Negative.   Cardiovascular: Negative.   Gastrointestinal: Positive for abdominal pain.  Endocrine: Negative.   Genitourinary: Positive for pelvic pain.  Musculoskeletal: Negative.   Allergic/Immunologic: Negative.   Neurological: Positive for headaches.  Hematological: Negative.   Psychiatric/Behavioral: Positive for sleep disturbance and dysphoric mood.   Physical Exam not done Depressive Symptoms: depressed mood, anhedonia, psychomotor retardation, fatigue, hopelessness,  (Hypo) Manic Symptoms:   Elevated Mood:  No Irritable Mood:  No Grandiosity:  No Distractibility:  No Labiality of Mood:  No Delusions:  No Hallucinations:  No Impulsivity:  No Sexually Inappropriate Behavior:  No Financial Extravagance:  No Flight of Ideas:  No  Anxiety Symptoms: Excessive Worry:  Yes Panic Symptoms:  No Agoraphobia:  No Obsessive Compulsive: No  Symptoms: None, Specific Phobias:  No Social Anxiety:  No  Psychotic Symptoms:  Hallucinations: No None Delusions:  No Paranoia:  No   Ideas of Reference:  No  PTSD Symptoms: Ever had a traumatic exposure:  No Had a traumatic exposure in the last month:   No Re-experiencing: No None Hypervigilance:  No Hyperarousal: No None Avoidance: No None  Traumatic Brain Injury: No   Past Psychiatric History: Diagnosis: Depression   Hospitalizations: none  Outpatient Care: Has been seeing a therapist in [place] for a few weeks   Substance Abuse Care: none  Self-Mutilation: none  Suicidal Attempts: none  Violent Behaviors: none   Past Medical History:   Past Medical History  Diagnosis Date  . Allergy     salmon  . Thyroid disease   . Hypothyroidism   . History of kidney stones 04/2012    ureteral   History of Loss of Consciousness:  No Seizure History:  No Cardiac History:  No Allergies:   Allergies  Allergen Reactions  . Salmon [Fish Allergy] Swelling    Swelling of mouth   Current Medications:  Current Outpatient Prescriptions  Medication Sig Dispense Refill  . FLUoxetine (PROZAC) 20 MG capsule Take 1 capsule (20 mg total) by mouth daily.  30 capsule  2  . Hydrocodone-Acetaminophen 5-300 MG TABS Take 1 tablet by mouth every 6 (six) hours as needed.  30 each  0  . Multiple Vitamins-Minerals (HAIR/SKIN/NAILS PO) Take 1 tablet by mouth daily.      . norgestimate-ethinyl estradiol (ORTHO-CYCLEN,SPRINTEC,PREVIFEM) 0.25-35 MG-MCG tablet Take 1 tablet by mouth daily.       No current facility-administered medications for this visit.    Previous Psychotropic Medications:  Medication Dose   Prozac 10 mg daily                       Substance Abuse History in the last 12 months: Substance Age of 1st Use Last Use Amount Specific Type  Nicotine      Alcohol      Cannabis      Opiates      Cocaine      Methamphetamines      LSD      Ecstasy      Benzodiazepines      Caffeine      Inhalants      Others:                          Medical Consequences of Substance Abuse: n/a  Legal Consequences of Substance Abuse: n/a  Family Consequences of Substance Abuse: n/a  Blackouts:  No DT's:  No Withdrawal Symptoms:   No None  Social History: Current Place of Residence: [facility] [address] of Birth: [place] Family Members: Mother, 73 year old daughter, 6 siblings, father and [place] Marital Status:  Separated Children:   Sons:   Daughters: 67 y/o Relationships: Still sees her husband and they're trying to work out the marriage but he has had several affairs Education:  Corporate treasurer Problems/Performance:  Religious Beliefs/Practices: Christian History of Abuse: none Armed forces technical officer; Environmental education officer History:  None. Legal History: none Hobbies/Interests: Cleaning her house  Family History:   Family History  Problem Relation Age of Onset  . Diabetes Mother   .  Hypertension Mother   . Hyperlipidemia Mother   . Glaucoma Mother   . Prostatitis Father     Mental Status Examination/Evaluation: Objective:  Appearance: Casual, Neat and Well Groomed  Eye Contact::  Good  Speech:  Clear and Coherent  Volume:  Normal  Mood: Good   Affect bright   Thought Process:  Goal Directed  Orientation:  Full (Time, Place, and Person)  Thought Content:  WDL  Suicidal Thoughts:  No  Homicidal Thoughts:  No  Judgement:  Good  Insight:  Good  Psychomotor Activity:  Normal  Akathisia:  No  Handed:  Right  AIMS (if indicated):    Assets:  Communication Skills Desire for Improvement Social Support Talents/Skills Vocational/Educational    Laboratory/X-Ray Psychological Evaluation(s)   Reviewed in the record      Assessment:  Axis I: Major Depression, single episode  AXIS I Major Depression, single episode  AXIS II Deferred  AXIS III Past Medical History  Diagnosis Date  . Allergy     salmon  . Thyroid disease   . Hypothyroidism   . History of kidney stones 04/2012    ureteral     AXIS IV occupational problems  AXIS V 61-70 mild symptoms   Treatment Plan/Recommendations:  Plan of Care: Medication management   Laboratory:     Psychotherapy: The patient states she would like to continue with her current counselor   Medications: Continue Prozac to 20 mg every morning   Routine PRN Medications:  No  Consultations:   Safety Concerns:  She has no thoughts of self-harm   Other: She'll return in 6 weeks     Diannia RuderOSS, DEBORAH, MD 6/19/20154:44 PM

## 2014-04-08 ENCOUNTER — Encounter (HOSPITAL_COMMUNITY): Payer: Self-pay | Admitting: Psychology

## 2014-04-08 NOTE — Progress Notes (Signed)
PROGRESS NOTE  Patient:  Christine Fitzgerald   DOB: 07/19/1976  MR Number: [id number]  Location: [facility]HAVIORAL [facility] [address] [place] [id number] Dept: [phone]  Start: 4 PM End: 5 PM  Provider/Observer:     Hershal CoriaJohn R Rodenbough PSYD  Chief Complaint:      Chief Complaint  Patient presents with  . Depression    Reason For Service:     The patient was referred by Dr. Tenny Crawoss after the patient's primary care Dr. Dr. Lodema HongSimpson. The patient is a 38 year old female who's been dealing with depression and significant stress and feeling overwhelmed at work. The patient reports that she has had a lot going on the past couple of years. At first she did not identify what was happening but is now realized that depression is a major issue. The patient has been working at [employer] for about 15 years and had been on the team of 6 when she was later moved to a position where she was essentially working on her own. While she was promised Mrs. that this never occurred. The patient began and significantly increasing her work hours point that she was working as many as 10 or more hours day. She developed a number of physical difficulties and medical issues that also were challenging to cope with. The patient describes moderate to significant symptoms of depression, anxiety, appetite changes, sleep disturbance, work stress, loss of interest, excessive worrying, low energy,. The patient reports that these have been much more significant the past 2 years.  Interventions Strategy:  Cognitive/behavioral psychotherapeutic interventions  Participation Level:   Active  Participation Quality:  Appropriate      Behavioral Observation:  Well Groomed, Alert, and Appropriate.   Current Psychosocial Factors: The patient reports that she is returned to work and that while this is been very stressful and there have been some complications overall  she is doing okay with this. They promised health in some ways they have helped her but in other ways there continue to have too much expectations and what she should accomplish a day. Most of time she has been able to leave the office at 5 PM and leaves him work behind. Her supervisors office realized that goals be expected for her was way too much when they were forced to adjust her today we worked on issues with some of stressors have to do with her daughter and the daughter's expectations at home..  Content of Session:   Reviewed current symptoms and worked on coping skills to better manage and cope with significant stress and symptoms of depression including feeling overwhelmed, worrying, feeling helpless and hopeless, social isolation, sleep disturbance, and appetite changes.  Current Status:   The patient reports that she has been better and been able to return to work, which has been successful up to this point..  Patient Progress:   Stable  Target Goals:   Target goals include reducing the intensity, duration, and frequency of symptoms of depression and anxiety.  Last Reviewed:   03/30/2014  Goals Addressed Today:    Today we worked on building and establishing coping skills to better manage symptoms of depression and anxiety.  Impression/Diagnosis:   The patient is had a very stressful situation in her job over the past 2 years. Patient denies symptoms of depression prior to past 2 years but reports that these symptoms have been worsening over the it does appear that there is a  major depressive issue in significant stress and anxiety primarily being driven by stress.  Diagnosis:    Axis I: Depression    RODENBOUGH,JOHN R, PsyD 04/08/2014

## 2014-05-16 ENCOUNTER — Ambulatory Visit (INDEPENDENT_AMBULATORY_CARE_PROVIDER_SITE_OTHER): Payer: BC Managed Care – PPO | Admitting: Psychiatry

## 2014-05-16 ENCOUNTER — Encounter (HOSPITAL_COMMUNITY): Payer: Self-pay | Admitting: Psychiatry

## 2014-05-16 VITALS — BP 120/80 | HR 79 | Ht 64.0 in | Wt 174.0 lb

## 2014-05-16 DIAGNOSIS — F3289 Other specified depressive episodes: Secondary | ICD-10-CM

## 2014-05-16 DIAGNOSIS — F32A Depression, unspecified: Secondary | ICD-10-CM

## 2014-05-16 DIAGNOSIS — F329 Major depressive disorder, single episode, unspecified: Secondary | ICD-10-CM

## 2014-05-16 MED ORDER — FLUOXETINE HCL 20 MG PO CAPS: 20.0000 mg | ORAL_CAPSULE | Freq: Every day | ORAL | Status: DC

## 2014-05-16 NOTE — Progress Notes (Signed)
Patient ID: Christine Fitzgerald, female   DOB: [birth year], 38 y.o.   MRN: [id number] Patient ID: Christine Fitzgerald, female   DOB: 04-06-1976, 38 y.o.   MRN: [id number]  Psychiatric Assessment Adult  Patient Identification:  Christine Fitzgerald Date of Evaluation:  05/16/2014 Chief Complaint: "My job has been overwhelming." History of Chief Complaint:   Chief Complaint  Patient presents with  . Depression  . Anxiety  . Follow-up    Anxiety     this patient is a 38 year old separated black female who lives with her mother and 56 year old daughter in [place]. She works as a Dance movement psychotherapist for [employer].  The patient was referred by Dr. Syliva Overman, her primary care physician for further treatment and assessment of depression.  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 In 2012 she was transferred from a team of 6 people that she worked with to a new position in which she had responsibility for many departments. She was overwhelmed with work and often stayed till 10 or 11 at night. She was supposed to get an Geophysicist/field seismologist but status supervisor was tired and she claims a supervisor doesn't know how to do the job. She tried to train an Geophysicist/field seismologist and the person couldn't seem to learn to tasks necessary. The patient had become increasingly overwhelmed and stressed. She had stomachaches and headaches every time she came to work. She became increasingly depressed with low energy and low mood. She started spending most of her weekend time in bed. She's never been suicidal but feels very frustrated with her current situation. She does not use alcohol or drugs and is ever had psychotic symptoms or any previous psychiatric treatment.  On April 9 the patient had a D&C. She had a lot of pain for several weeks afterwards and has been out of work on medical leave. She tried to go back but was overwhelmed with the job again. She has to go back on June 9 and has reconciled herself to this idea but is  going to try to either make some changes at work or find another job. About 2 weeks ago Dr. Shon Hale started her on Prozac 10 mg and she's starting to feel a little better but this might be due to the fact that she's at home and doesn't have to go to work. I told her she is going to be on Prozac it would make more sense to use the 20 mg dose for in and out and she agrees. She has been seeing a therapist in [place] for several weeks and would like to continue this.  The patient returns after 6 weeks. She is back working and the job is starting to get to her again. She gets stressed when people ask her to do more than she is able to do. She feels like sometimes the Prozac wears off around lunchtime and I told her this wasn't pharmacologically possible. We stopped about increasing it but she doesn't really want to do this or add any medicines for anxiety. She is sleeping well and exercising with her family. She and her siblings are texting each other every day and they're being very supportive. She's going to try to look for a different job is less stressful. She denies suicidal ideation Review of Systems  Constitutional: Positive for activity change.  Eyes: Negative.   Respiratory: Negative.   Cardiovascular: Negative.   Gastrointestinal: Positive for abdominal pain.  Endocrine: Negative.   Genitourinary: Positive for  pelvic pain.  Musculoskeletal: Negative.   Allergic/Immunologic: Negative.   Neurological: Positive for headaches.  Hematological: Negative.   Psychiatric/Behavioral: Positive for sleep disturbance and dysphoric mood.   Physical Exam not done Depressive Symptoms: depressed mood, anhedonia, psychomotor retardation, fatigue, hopelessness,  (Hypo) Manic Symptoms:   Elevated Mood:  No Irritable Mood:  No Grandiosity:  No Distractibility:  No Labiality of Mood:  No Delusions:  No Hallucinations:  No Impulsivity:  No Sexually Inappropriate Behavior:  No Financial  Extravagance:  No Flight of Ideas:  No  Anxiety Symptoms: Excessive Worry:  Yes Panic Symptoms:  No Agoraphobia:  No Obsessive Compulsive: No  Symptoms: None, Specific Phobias:  No Social Anxiety:  No  Psychotic Symptoms:  Hallucinations: No None Delusions:  No Paranoia:  No   Ideas of Reference:  No  PTSD Symptoms: Ever had a traumatic exposure:  No Had a traumatic exposure in the last month:  No Re-experiencing: No None Hypervigilance:  No Hyperarousal: No None Avoidance: No None  Traumatic Brain Injury: No   Past Psychiatric History: Diagnosis: Depression   Hospitalizations: none  Outpatient Care: Has been seeing a therapist in [place] for a few weeks   Substance Abuse Care: none  Self-Mutilation: none  Suicidal Attempts: none  Violent Behaviors: none   Past Medical History:   Past Medical History  Diagnosis Date  . Allergy     salmon  . Thyroid disease   . Hypothyroidism   . History of kidney stones 04/2012    ureteral   History of Loss of Consciousness:  No Seizure History:  No Cardiac History:  No Allergies:   Allergies  Allergen Reactions  . Salmon [Fish Allergy] Swelling    Swelling of mouth   Current Medications:  Current Outpatient Prescriptions  Medication Sig Dispense Refill  . FLUoxetine (PROZAC) 20 MG capsule Take 1 capsule (20 mg total) by mouth daily.  30 capsule  2  . Hydrocodone-Acetaminophen 5-300 MG TABS Take 1 tablet by mouth every 6 (six) hours as needed.  30 each  0  . zonisamide (ZONEGRAN) 25 MG capsule Take 25 mg by mouth daily.       No current facility-administered medications for this visit.    Previous Psychotropic Medications:  Medication Dose   Prozac 10 mg daily                       Substance Abuse History in the last 12 months: Substance Age of 1st Use Last Use Amount Specific Type  Nicotine      Alcohol      Cannabis      Opiates      Cocaine      Methamphetamines      LSD      Ecstasy       Benzodiazepines      Caffeine      Inhalants      Others:                          Medical Consequences of Substance Abuse: n/a  Legal Consequences of Substance Abuse: n/a  Family Consequences of Substance Abuse: n/a  Blackouts:  No DT's:  No Withdrawal Symptoms:  No None  Social History: [place] [facility] [place] [address] [place] Place of Birth[place] Family Members: Mother, 37 year old daughter, 6 siblings, father and [place] Marital Status:  Separated Children:   Sons:   Daughters: 37 y/o Relationships:  Still sees her husband and they're trying to work out the marriage but he has had several affairs Education:  Corporate treasurer Problems/Performance:  Religious Beliefs/Practices: Christian History of Abuse: none Armed forces technical officer; Environmental education officer History:  None. Legal History: none Hobbies/Interests: Cleaning her house  Family History:   Family History  Problem Relation Age of Onset  . Diabetes Mother   . Hypertension Mother   . Hyperlipidemia Mother   . Glaucoma Mother   . Prostatitis Father     Mental Status Examination/Evaluation: Objective:  Appearance: Casual, Neat and Well Groomed  Eye Contact::  Good  Speech:  Clear and Coherent  Volume:  Normal  Mood: Good   Affect bright   Thought Process:  Goal Directed  Orientation:  Full (Time, Place, and Person)  Thought Content:  WDL  Suicidal Thoughts:  No  Homicidal Thoughts:  No  Judgement:  Good  Insight:  Good  Psychomotor Activity:  Normal  Akathisia:  No  Handed:  Right  AIMS (if indicated):    Assets:  Communication Skills Desire for Improvement Social Support Talents/Skills Vocational/Educational    Laboratory/X-Ray Psychological Evaluation(s)   Reviewed in the record      Assessment:  Axis I: Major Depression, single episode  AXIS I Major Depression, single episode  AXIS II Deferred  AXIS III Past Medical History   Diagnosis Date  . Allergy     salmon  . Thyroid disease   . Hypothyroidism   . History of kidney stones 04/2012    ureteral     AXIS IV occupational problems  AXIS V 61-70 mild symptoms   Treatment Plan/Recommendations:  Plan of Care: Medication management   Laboratory:    Psychotherapy: The patient states she would like to continue with her current counselor   Medications: Continue Prozac to 20 mg every morning   Routine PRN Medications:  No  Consultations:   Safety Concerns:  She has no thoughts of self-harm   Other: She'll return in 8 weeks     Diannia Ruder, MD 8/3/201511:11 AM

## 2014-07-25 ENCOUNTER — Ambulatory Visit (HOSPITAL_COMMUNITY): Payer: Self-pay | Admitting: Psychiatry

## 2014-08-21 DIAGNOSIS — R109 Unspecified abdominal pain: Secondary | ICD-10-CM | POA: Insufficient documentation

## 2014-08-21 DIAGNOSIS — R519 Headache, unspecified: Secondary | ICD-10-CM | POA: Insufficient documentation

## 2014-08-21 DIAGNOSIS — R51 Headache: Secondary | ICD-10-CM

## 2014-08-21 NOTE — Assessment & Plan Note (Signed)
ongoing c/o abdominal pain, colicky, following gyne surgery, denies change in BM, will continue to monitor. I did speak with the gyne Doc during the visit, she had no reason to believe that the pain pt was experiencing was related to recent surgery, and maintained that from her standpoint pt should n be able to work

## 2014-08-21 NOTE — Assessment & Plan Note (Signed)
Still c/o uncontrolled headaches, needs to go to neurologist, reminded her of need pot follow through on referrals, states she will go to see specialist, as symptom persists

## 2014-08-21 NOTE — Assessment & Plan Note (Signed)
Unchanged. Patient re-educated about  the importance of commitment to a  minimum of 150 minutes of exercise per week. The importance of healthy food choices with portion control discussed. Encouraged to start a food diary, count calories and to consider  joining a support group. Sample diet sheets offered. Goals set by the patient for the next several months.    

## 2014-09-22 ENCOUNTER — Other Ambulatory Visit (HOSPITAL_COMMUNITY): Payer: Self-pay | Admitting: Psychiatry

## 2014-09-28 ENCOUNTER — Other Ambulatory Visit (HOSPITAL_COMMUNITY): Payer: Self-pay | Admitting: *Deleted

## 2014-09-28 ENCOUNTER — Telehealth (HOSPITAL_COMMUNITY): Payer: Self-pay | Admitting: *Deleted

## 2014-09-28 MED ORDER — FLUOXETINE HCL 20 MG PO CAPS: 20.0000 mg | ORAL_CAPSULE | Freq: Every day | ORAL | Status: DC

## 2014-09-28 NOTE — Telephone Encounter (Signed)
Per Dr. Tenny Crawoss to send 1 mo supply to pt pharmacy

## 2014-09-28 NOTE — Telephone Encounter (Signed)
lmtcb to make f/u appt. Pt pharmacy requesting refills for pt Fluoxetine HCL 20 mg QD and pt have not been seen since 05-16-14 and cancelled her last appt with Dr. Tenny Crawoss 07-25-14. office number provided.

## 2014-09-28 NOTE — Telephone Encounter (Signed)
rx sent to pt pharmacy 

## 2014-09-28 NOTE — Telephone Encounter (Signed)
Call in one month refill

## 2014-11-13 ENCOUNTER — Other Ambulatory Visit (HOSPITAL_COMMUNITY): Payer: Self-pay | Admitting: Psychiatry

## 2014-12-12 ENCOUNTER — Telehealth: Payer: Self-pay | Admitting: Family Medicine

## 2014-12-13 NOTE — Telephone Encounter (Signed)
[facility] [address] [place] [phone]

## 2014-12-14 NOTE — Telephone Encounter (Signed)
Was supposed to follow up in 5 weeks a year ago. No referral without office visit

## 2014-12-14 NOTE — Telephone Encounter (Signed)
The patient made her own appointment with Dr Merlyn LotKuzma for Friday 3.4.2016

## 2014-12-15 ENCOUNTER — Other Ambulatory Visit (HOSPITAL_COMMUNITY): Payer: Self-pay | Admitting: Psychiatry

## 2014-12-28 ENCOUNTER — Telehealth (HOSPITAL_COMMUNITY): Payer: Self-pay | Admitting: *Deleted

## 2014-12-28 NOTE — Telephone Encounter (Signed)
Called pt to make follow up appt due to pt pharmacy requesting refills for her Fluoxetine 20 mg QD. Number provided. Pt was last in office for f/u 05-16-2014. Pt medication was last filled 09-27-2014 30 tablets 0 refills. Tried contacting pt 09-28-2014 to set up appt but was unable and left message to call office back.

## 2014-12-28 NOTE — Telephone Encounter (Signed)
noted 

## 2015-01-03 ENCOUNTER — Other Ambulatory Visit: Payer: Self-pay | Admitting: Obstetrics and Gynecology

## 2015-01-03 ENCOUNTER — Other Ambulatory Visit (HOSPITAL_COMMUNITY)
Admission: RE | Admit: 2015-01-03 | Discharge: 2015-01-03 | Disposition: A | Discharge status: Home / Self Care | Payer: BC Managed Care – PPO | Source: Ambulatory Visit | Attending: Obstetrics and Gynecology | Admitting: Obstetrics and Gynecology

## 2015-01-03 DIAGNOSIS — Z1151 Encounter for screening for human papillomavirus (HPV): Secondary | ICD-10-CM | POA: Diagnosis present

## 2015-01-03 DIAGNOSIS — Z01419 Encounter for gynecological examination (general) (routine) without abnormal findings: Secondary | ICD-10-CM | POA: Diagnosis present

## 2015-01-06 LAB — CYTOLOGY - PAP

## 2015-01-13 DIAGNOSIS — M65841 Other synovitis and tenosynovitis, right hand: Secondary | ICD-10-CM

## 2015-01-13 HISTORY — DX: Other synovitis and tenosynovitis, right hand: M65.841

## 2015-02-04 DIAGNOSIS — R21 Rash and other nonspecific skin eruption: Secondary | ICD-10-CM

## 2015-02-04 HISTORY — DX: Rash and other nonspecific skin eruption: R21

## 2015-02-06 ENCOUNTER — Other Ambulatory Visit: Payer: Self-pay | Admitting: Orthopedic Surgery

## 2015-02-07 ENCOUNTER — Encounter (HOSPITAL_BASED_OUTPATIENT_CLINIC_OR_DEPARTMENT_OTHER): Payer: Self-pay | Admitting: *Deleted

## 2015-02-14 ENCOUNTER — Encounter (HOSPITAL_BASED_OUTPATIENT_CLINIC_OR_DEPARTMENT_OTHER): Payer: Self-pay | Admitting: Certified Registered"

## 2015-02-14 ENCOUNTER — Ambulatory Visit (HOSPITAL_BASED_OUTPATIENT_CLINIC_OR_DEPARTMENT_OTHER): Payer: BC Managed Care – PPO | Admitting: Certified Registered"

## 2015-02-14 ENCOUNTER — Ambulatory Visit (HOSPITAL_BASED_OUTPATIENT_CLINIC_OR_DEPARTMENT_OTHER)
Admission: RE | Admit: 2015-02-14 | Discharge: 2015-02-14 | Disposition: A | Discharge status: Home / Self Care | Payer: BC Managed Care – PPO | Source: Ambulatory Visit | Attending: Orthopedic Surgery | Admitting: Orthopedic Surgery

## 2015-02-14 ENCOUNTER — Encounter (HOSPITAL_BASED_OUTPATIENT_CLINIC_OR_DEPARTMENT_OTHER)
Admission: RE | Disposition: A | Discharge status: Home / Self Care | Payer: Self-pay | Source: Ambulatory Visit | Attending: Orthopedic Surgery

## 2015-02-14 DIAGNOSIS — Z87442 Personal history of urinary calculi: Secondary | ICD-10-CM | POA: Insufficient documentation

## 2015-02-14 DIAGNOSIS — Z6832 Body mass index (BMI) 32.0-32.9, adult: Secondary | ICD-10-CM | POA: Insufficient documentation

## 2015-02-14 DIAGNOSIS — G5601 Carpal tunnel syndrome, right upper limb: Secondary | ICD-10-CM | POA: Insufficient documentation

## 2015-02-14 DIAGNOSIS — Z91013 Allergy to seafood: Secondary | ICD-10-CM | POA: Insufficient documentation

## 2015-02-14 DIAGNOSIS — G4700 Insomnia, unspecified: Secondary | ICD-10-CM | POA: Insufficient documentation

## 2015-02-14 DIAGNOSIS — M65331 Trigger finger, right middle finger: Secondary | ICD-10-CM | POA: Insufficient documentation

## 2015-02-14 DIAGNOSIS — G43909 Migraine, unspecified, not intractable, without status migrainosus: Secondary | ICD-10-CM | POA: Diagnosis not present

## 2015-02-14 DIAGNOSIS — E669 Obesity, unspecified: Secondary | ICD-10-CM | POA: Insufficient documentation

## 2015-02-14 DIAGNOSIS — F419 Anxiety disorder, unspecified: Secondary | ICD-10-CM | POA: Insufficient documentation

## 2015-02-14 HISTORY — DX: Personal history of other diseases of the nervous system and sense organs: Z86.69

## 2015-02-14 HISTORY — PX: CARPAL TUNNEL RELEASE: SHX101

## 2015-02-14 HISTORY — PX: TRIGGER FINGER RELEASE: SHX641

## 2015-02-14 HISTORY — DX: Depression, unspecified: F32.A

## 2015-02-14 HISTORY — DX: Major depressive disorder, single episode, unspecified: F32.9

## 2015-02-14 HISTORY — DX: Other synovitis and tenosynovitis, right hand: M65.841

## 2015-02-14 HISTORY — DX: Rash and other nonspecific skin eruption: R21

## 2015-02-14 LAB — POCT HEMOGLOBIN-HEMACUE: HEMOGLOBIN: 11.4 g/dL — AB (ref 12.0–15.0)

## 2015-02-14 SURGERY — CARPAL TUNNEL RELEASE
Anesthesia: Monitor Anesthesia Care | Site: Wrist | Laterality: Right

## 2015-02-14 MED ORDER — LIDOCAINE HCL (CARDIAC) 20 MG/ML IV SOLN
INTRAVENOUS | Status: DC | PRN
  Administered 2015-02-14: 25 mg via INTRAVENOUS

## 2015-02-14 MED ORDER — CEFAZOLIN SODIUM-DEXTROSE 2-3 GM-% IV SOLR: 2.0000 g | INTRAVENOUS | Status: DC

## 2015-02-14 MED ORDER — CHLORHEXIDINE GLUCONATE 4 % EX LIQD: 60.0000 mL | Freq: Once | CUTANEOUS | Status: DC

## 2015-02-14 MED ORDER — LACTATED RINGERS IV SOLN
INTRAVENOUS | Status: DC
  Administered 2015-02-14: 10:00:00 via INTRAVENOUS

## 2015-02-14 MED ORDER — MIDAZOLAM HCL 2 MG/2ML IJ SOLN
INTRAMUSCULAR | Status: AC
  Filled 2015-02-14: qty 2

## 2015-02-14 MED ORDER — HYDROCODONE-ACETAMINOPHEN 5-325 MG PO TABS: 1.0000 | ORAL_TABLET | Freq: Four times a day (QID) | ORAL | Status: DC | PRN

## 2015-02-14 MED ORDER — FENTANYL CITRATE (PF) 100 MCG/2ML IJ SOLN
INTRAMUSCULAR | Status: AC
  Filled 2015-02-14: qty 4

## 2015-02-14 MED ORDER — ONDANSETRON HCL 4 MG/2ML IJ SOLN
INTRAMUSCULAR | Status: DC | PRN
  Administered 2015-02-14: 4 mg via INTRAVENOUS

## 2015-02-14 MED ORDER — CEFAZOLIN SODIUM-DEXTROSE 2-3 GM-% IV SOLR
2.0000 g | INTRAVENOUS | Status: AC
  Administered 2015-02-14 (×2): 2 g via INTRAVENOUS

## 2015-02-14 MED ORDER — FENTANYL CITRATE (PF) 100 MCG/2ML IJ SOLN
50.0000 ug | INTRAMUSCULAR | Status: DC | PRN
Start: 2015-02-14 — End: 2015-02-14

## 2015-02-14 MED ORDER — MIDAZOLAM HCL 2 MG/ML PO SYRP: 12.0000 mg | ORAL_SOLUTION | Freq: Once | ORAL | Status: DC | PRN

## 2015-02-14 MED ORDER — BUPIVACAINE HCL (PF) 0.25 % IJ SOLN
INTRAMUSCULAR | Status: AC
  Filled 2015-02-14: qty 30

## 2015-02-14 MED ORDER — PROMETHAZINE HCL 25 MG/ML IJ SOLN
6.2500 mg | INTRAMUSCULAR | Status: DC | PRN
Start: 2015-02-14 — End: 2015-02-14

## 2015-02-14 MED ORDER — LIDOCAINE HCL (PF) 0.5 % IJ SOLN
INTRAMUSCULAR | Status: DC | PRN
  Administered 2015-02-14: 30 mL via INTRAVENOUS

## 2015-02-14 MED ORDER — MIDAZOLAM HCL 2 MG/2ML IJ SOLN
1.0000 mg | INTRAMUSCULAR | Status: DC | PRN
  Administered 2015-02-14: 2 mg via INTRAVENOUS

## 2015-02-14 MED ORDER — GLYCOPYRROLATE 0.2 MG/ML IJ SOLN: 0.2000 mg | Freq: Once | INTRAMUSCULAR | Status: DC | PRN

## 2015-02-14 MED ORDER — HYDROMORPHONE HCL 1 MG/ML IJ SOLN: 0.2500 mg | INTRAMUSCULAR | Status: DC | PRN

## 2015-02-14 MED ORDER — PROPOFOL INFUSION 10 MG/ML OPTIME
INTRAVENOUS | Status: DC | PRN
  Administered 2015-02-14: 100 ug/kg/min via INTRAVENOUS

## 2015-02-14 MED ORDER — CEFAZOLIN SODIUM-DEXTROSE 2-3 GM-% IV SOLR
INTRAVENOUS | Status: AC
  Filled 2015-02-14: qty 50

## 2015-02-14 MED ORDER — FENTANYL CITRATE (PF) 100 MCG/2ML IJ SOLN
INTRAMUSCULAR | Status: DC | PRN
  Administered 2015-02-14 (×2): 50 ug via INTRAVENOUS

## 2015-02-14 MED ORDER — BUPIVACAINE HCL (PF) 0.25 % IJ SOLN
INTRAMUSCULAR | Status: DC | PRN
  Administered 2015-02-14: 10 mL

## 2015-02-14 SURGICAL SUPPLY — 43 items
BANDAGE COBAN STERILE 2 (GAUZE/BANDAGES/DRESSINGS) ×2 IMPLANT
BLADE SURG 15 STRL LF DISP TIS (BLADE) ×2 IMPLANT
BLADE SURG 15 STRL SS (BLADE) ×3
BNDG CMPR 9X4 STRL LF SNTH (GAUZE/BANDAGES/DRESSINGS)
BNDG COHESIVE 3X5 TAN STRL LF (GAUZE/BANDAGES/DRESSINGS) ×3 IMPLANT
BNDG ESMARK 4X9 LF (GAUZE/BANDAGES/DRESSINGS) IMPLANT
BNDG GAUZE ELAST 4 BULKY (GAUZE/BANDAGES/DRESSINGS) ×3 IMPLANT
CHLORAPREP W/TINT 26ML (MISCELLANEOUS) ×3 IMPLANT
CORDS BIPOLAR (ELECTRODE) ×3 IMPLANT
COVER BACK TABLE 60X90IN (DRAPES) ×3 IMPLANT
COVER MAYO STAND STRL (DRAPES) ×3 IMPLANT
CUFF TOURNIQUET SINGLE 18IN (TOURNIQUET CUFF) ×3 IMPLANT
DECANTER SPIKE VIAL GLASS SM (MISCELLANEOUS) IMPLANT
DRAPE EXTREMITY T 121X128X90 (DRAPE) ×3 IMPLANT
DRAPE SURG 17X23 STRL (DRAPES) ×3 IMPLANT
DRSG PAD ABDOMINAL 8X10 ST (GAUZE/BANDAGES/DRESSINGS) ×3 IMPLANT
GAUZE SPONGE 4X4 12PLY STRL (GAUZE/BANDAGES/DRESSINGS) ×3 IMPLANT
GAUZE XEROFORM 1X8 LF (GAUZE/BANDAGES/DRESSINGS) ×3 IMPLANT
GLOVE BIO SURGEON STRL SZ7 (GLOVE) ×1 IMPLANT
GLOVE BIO SURGEON STRL SZ7.5 (GLOVE) ×1 IMPLANT
GLOVE BIOGEL PI IND STRL 7.0 (GLOVE) IMPLANT
GLOVE BIOGEL PI IND STRL 8 (GLOVE) IMPLANT
GLOVE BIOGEL PI IND STRL 8.5 (GLOVE) ×2 IMPLANT
GLOVE BIOGEL PI INDICATOR 7.0 (GLOVE) ×2
GLOVE BIOGEL PI INDICATOR 8 (GLOVE) ×1
GLOVE BIOGEL PI INDICATOR 8.5 (GLOVE) ×1
GLOVE ECLIPSE 6.5 STRL STRAW (GLOVE) ×1 IMPLANT
GLOVE SURG ORTHO 8.0 STRL STRW (GLOVE) ×3 IMPLANT
GLOVE SURG SS PI 7.5 STRL IVOR (GLOVE) ×1 IMPLANT
GOWN STRL REUS W/ TWL LRG LVL3 (GOWN DISPOSABLE) ×2 IMPLANT
GOWN STRL REUS W/TWL LRG LVL3 (GOWN DISPOSABLE) ×6
GOWN STRL REUS W/TWL XL LVL3 (GOWN DISPOSABLE) ×4 IMPLANT
NDL PRECISIONGLIDE 27X1.5 (NEEDLE) ×2 IMPLANT
NEEDLE PRECISIONGLIDE 27X1.5 (NEEDLE) ×3 IMPLANT
NS IRRIG 1000ML POUR BTL (IV SOLUTION) ×3 IMPLANT
PACK BASIN DAY SURGERY FS (CUSTOM PROCEDURE TRAY) ×3 IMPLANT
STOCKINETTE 4X48 STRL (DRAPES) ×3 IMPLANT
SUT ETHILON 4 0 PS 2 18 (SUTURE) ×3 IMPLANT
SUT VICRYL 4-0 PS2 18IN ABS (SUTURE) IMPLANT
SYR BULB 3OZ (MISCELLANEOUS) ×3 IMPLANT
SYR CONTROL 10ML LL (SYRINGE) ×3 IMPLANT
TOWEL OR 17X24 6PK STRL BLUE (TOWEL DISPOSABLE) ×6 IMPLANT
UNDERPAD 30X30 (UNDERPADS AND DIAPERS) ×2 IMPLANT

## 2015-02-14 NOTE — Discharge Instructions (Addendum)

## 2015-02-14 NOTE — Anesthesia Preprocedure Evaluation (Signed)
Anesthesia Evaluation  Patient identified by MRN, date of birth, ID band Patient awake    Reviewed: Allergy & Precautions, H&P , NPO status , Patient's Chart, lab work & pertinent test results  Airway Mallampati: II  TM Distance: >3 FB Neck ROM: full    Dental  (+) Teeth Intact, Dental Advidsory Given   Pulmonary neg pulmonary ROS,  breath sounds clear to auscultation  Pulmonary exam normal       Cardiovascular negative cardio ROS  Rhythm:regular Rate:Normal     Neuro/Psych  Headaches, negative psych ROS   GI/Hepatic negative GI ROS, Neg liver ROS,   Endo/Other  negative endocrine ROS  Renal/GU negative Renal ROS     Musculoskeletal   Abdominal Normal abdominal exam  (+)   Peds  Hematology   Anesthesia Other Findings   Reproductive/Obstetrics negative OB ROS                             Anesthesia Physical Anesthesia Plan  ASA: II  Anesthesia Plan: MAC and Bier Block   Post-op Pain Management:    Induction:   Airway Management Planned:   Additional Equipment:   Intra-op Plan:   Post-operative Plan:   Informed Consent: I have reviewed the patients History and Physical, chart, labs and discussed the procedure including the risks, benefits and alternatives for the proposed anesthesia with the patient or authorized representative who has indicated his/her understanding and acceptance.   Dental Advisory Given  Plan Discussed with: Anesthesiologist, CRNA and Surgeon  Anesthesia Plan Comments:         Anesthesia Quick Evaluation

## 2015-02-14 NOTE — Brief Op Note (Signed)
02/14/2015  11:00 AM  PATIENT:  Blake DivineMarcia Ceasar  39 y.o. female  PRE-OPERATIVE DIAGNOSIS:  RIGHT CARPAL TUNNEL SYNDROME, STENOSING TENOSYNOVITIS RIGHT MIDDLE FINGER  POST-OPERATIVE DIAGNOSIS:  RIGHT CARPAL TUNNEL SYNDROME, STENOSING TENOSYNOVITIS RIGHT MIDDLE FINGER  PROCEDURE:  Procedure(s): RIGHT CARPAL TUNNEL RELEASE (Right) RELEASE A-1 PULLY RIGHT MIDDLE FINGER (Right)  SURGEON:  Surgeon(s) and Role:    * Cindee SaltGary Pammy Vesey, MD - Primary    * Betha LoaKevin Damieon Armendariz, MD - Assisting  PHYSICIAN ASSISTANT:   ASSISTANTS: K Jaedan Schuman,MD   ANESTHESIA:   local and regional  EBL:  Total I/O In: 400 [I.V.:400] Out: -   BLOOD ADMINISTERED:none  DRAINS: none   LOCAL MEDICATIONS USED:  BUPIVICAINE   SPECIMEN:  No Specimen  DISPOSITION OF SPECIMEN:  N/A  COUNTS:  YES  TOURNIQUET:   Total Tourniquet Time Documented: Forearm (Right) - 25 minutes Total: Forearm (Right) - 25 minutes   DICTATION: .Other Dictation: Dictation Number [id number]  PLAN OF CARE: Discharge to home after PACU  PATIENT DISPOSITION:  PACU - hemodynamically stable.

## 2015-02-14 NOTE — H&P (Signed)
Leta JunglingMarcia is a 39 year-old right-hand dominant female who comes in complaining of bilateral hand numbness and tingling, relatively constant basis.  This has been going on since last summer. She recalls no history of injury to the hand or neck.  She had nerve conductions by Dr. Neale BurlyFreeman in August of 2005 revealing bilateral carpal tunnel syndrome with a motor delay of 3.1/left and 4.1/right;  sensory delay of 3.3 and 3.5 left and right.  She has taken some ibuprofen with minimal relief.  She is awakened 7 out of 7 nights. She has no history of injury to the hand or neck.  She has not had any other treatment. She has n history of diabetes, thyroid problems, arthritis or gout.  Her right is equal to the left side.  She complains of a severe throbbing, aching, burning pain with feeling of swelling, numbness and weakness.  She states it is gradually getting worse.  She has worn a brace intermittently. She has developed catching of her middle finger right hand. Exam reveals tenderness at the A-1 pulley.  ALLERGIES:    None.  MEDICATIONS:    Fluoxetine, Aleve PRN, temazepam.  SURGICAL HISTORY:    Stomach, but does not recall procedure.  FAMILY MEDICAL HISTORY:    Positive for diabetes, high blood pressure and arthritis.  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  REVIEW OF SYSTEMS:   Positive for headaches, depression, otherwise negative 14 points Blake DivineMarcia Ratledge is an 39 y.o. female.   Chief Complaint: CTS right and trigger right middle finger HPI: see above  Past Medical History  Diagnosis Date  . History of kidney stones   . History of migraine headaches   . Depression   . Carpal tunnel syndrome of right wrist 01/2015  . Stenosing tenosynovitis of finger of right hand 01/2015    middle finger  . Rash of neck 02/04/2015    from sun exposure, per pt.    Past Surgical History  Procedure Laterality Date  . Dilatation &  currettage/hysteroscopy with resectocope N/A 01/20/2014    Procedure: DILATATION & CURETTAGE/HYSTEROSCOPY WITH RESECTOCOPE;  Surgeon: Serita KyleSheronette A Cousins, MD;  Location: [facility];  Service: Gynecology;  Laterality: N/A;  2 1/2 hrs. total  . Laparoscopy N/A 01/20/2014    Procedure: LAPAROSCOPY DIAGNOSTIC;  Surgeon: Serita KyleSheronette A Cousins, MD;  Location: [facility];  Service: Gynecology;  Laterality: N/A;  . Robotic assisted laparoscopic lysis of adhesion N/A 01/20/2014    Procedure:  ROBOTIC ASSISTED LAPAROSCOPIC EXTENSIVE  LYSIS OF ADHESION;  BILATERAL SALPINGECTOMY;  Surgeon: Serita KyleSheronette A Cousins, MD;  Location: [facility];  Service: Gynecology;  Laterality: N/A;    Family History  Problem Relation Age of Onset  . Diabetes Mother   . Hypertension Mother   . Hyperlipidemia Mother   . Glaucoma Mother   . Prostatitis Father    Social History:  reports that she has never smoked. She has never used smokeless tobacco. She reports that she drinks alcohol. She reports that she does not use illicit drugs.  Allergies:  Allergies  Allergen Reactions  . Salmon [Fish Allergy] Itching, Swelling and Rash    SWELLING OF LIPS    Medications Prior to Admission  Medication Sig Dispense Refill  . FLUoxetine (PROZAC) 20 MG capsule Take 1 capsule (20 mg total) by mouth daily. 30 capsule 0  . Multiple Vitamins-Minerals (HAIR/SKIN/NAILS PO) Take by mouth.    . topiramate (TOPAMAX) 100 MG  tablet Take 100 mg by mouth as needed.      No results found for this or any previous visit (from the past 48 hour(s)).  No results found.   Pertinent items are noted in HPI.  Height  (1.6 m), weight 81.647 kg (180 lb), last menstrual period 01/09/2015.  General appearance: alert, cooperative and appears stated age Head: Normocephalic, without obvious abnormality Neck: no JVD Resp: clear to auscultation bilaterally Cardio: regular rate and rhythm, S1, S2 normal, no murmur, click, rub or gallop GI: soft, non-tender; bowel  sounds normal; no masses,  no organomegaly Extremities: trigger rt middle and numbnee right hand Pulses: 2+ and symmetric Skin: Skin color, texture, turgor normal. No rashes or lesions Neurologic: Grossly normal Incision/Wound: na  Assessment/Plan RADIOGRAPHS:    X-rays of her hands reveal bilateral lunotriquetral coalitions, otherwise negative.   DIAGNOSIS:     Bilateral carpal tunnel syndrome. STS right middle finger.  PLAN: She would like to proceed to have the right side released. Pre, peri and post op care are discussed along with risks and complications. Patient is aware there is no guarantee with surgery, possibility of infection, injury to arteries, nerves, and tendons, incomplete relief and dystrophy. We would recommend release of the A-1 pulley right middle finger at the same time of her right carpal tunnel release. She is in agreement with this. Questions are encouraged and answered to the patient's satisfaction. She is scheduled for release carpal canal right hand with release A-1 pulley right middle finger.     Malya Cirillo R 02/14/2015, 9:08 AM

## 2015-02-14 NOTE — Op Note (Signed)
Dictation Number [id number]

## 2015-02-14 NOTE — Anesthesia Procedure Notes (Signed)
Procedure Name: MAC Date/Time: 02/14/2015 10:30 AM Performed by: Curly ShoresRAFT, Rosalynn Sergent W Pre-anesthesia Checklist: Patient identified, Emergency Drugs available, Suction available, Patient being monitored and Timeout performed Patient Re-evaluated:Patient Re-evaluated prior to inductionOxygen Delivery Method: Simple face mask Preoxygenation: Pre-oxygenation with 100% oxygen Intubation Type: IV induction Dental Injury: Teeth and Oropharynx as per pre-operative assessment

## 2015-02-14 NOTE — Anesthesia Postprocedure Evaluation (Signed)
Anesthesia Post Note  Patient: Blake DivineMarcia Wurtz  Procedure(s) Performed: Procedure(s) (LRB): RIGHT CARPAL TUNNEL RELEASE (Right) RELEASE A-1 PULLY RIGHT MIDDLE FINGER (Right)  Anesthesia type: MAC  Patient location: PACU  Post pain: Pain level controlled  Post assessment: Post-op Vital signs reviewed  Last Vitals: BP 114/61 mmHg  Pulse 69  Temp(Src) 36.7 C (Oral)  Resp 20  Ht 5\' 3"  (1.6 m)  Wt 185 lb 8 oz (84.142 kg)  BMI 32.87 kg/m2  SpO2 98%  LMP 02/09/2015 (Exact Date)  Post vital signs: Reviewed  Level of consciousness: awake  Complications: No apparent anesthesia complications

## 2015-02-14 NOTE — Transfer of Care (Signed)
Immediate Anesthesia Transfer of Care Note  Patient: Christine DivineMarcia Edgington  Procedure(s) Performed: Procedure(s): RIGHT CARPAL TUNNEL RELEASE (Right) RELEASE A-1 PULLY RIGHT MIDDLE FINGER (Right)  Patient Location: PACU  Anesthesia Type:MAC and Bier block  Level of Consciousness: awake, alert  and oriented  Airway & Oxygen Therapy: Patient Spontanous Breathing and Patient connected to face mask oxygen  Post-op Assessment: Report given to RN, Post -op Vital signs reviewed and stable and Patient moving all extremities  Post vital signs: Reviewed and stable  Last Vitals:  Filed Vitals:   02/14/15 1104  BP:   Pulse: 87  Temp:   Resp: 14    Complications: No apparent anesthesia complications

## 2015-02-15 NOTE — Op Note (Signed)
Christine Fitzgerald:  Christine Fitzgerald, Christine Fitzgerald                  ACCOUNT NO.:  [id number]  MEDICAL RECORD NO.:  [id number]  LOCATION:                                FACILITY:  [facility]  PHYSICIAN:  Cindee SaltGary Kristen Bushway, M.D.       DATE OF BIRTH:  03-22-1976  DATE OF PROCEDURE:  02/14/2015 DATE OF DISCHARGE:  02/14/2015                              OPERATIVE REPORT   PREOPERATIVE DIAGNOSES:  Carpal tunnel syndrome, right hand; stenosing tenosynovitis, right middle finger.  POSTOPERATIVE DIAGNOSES:  Carpal tunnel syndrome, right hand; stenosing tenosynovitis, right middle finger.  OPERATIONS:  Release of A1 pulley, right middle finger with partial tenosynovectomy and carpal tunnel release, right hand.  SURGEON:  Cindee SaltGary Corbett Moulder, M.D.  ASSISTANT:  Betha LoaKevin Tomeka Kantner, MD  ANESTHESIA:  Forearm-based IV regional with local infiltration.  ANESTHESIOLOGIST:  Quita SkyeJames D. Krista BlueSinger, M.D.  HISTORY:  The patient is a 39 year old female with a history of carpal tunnel syndrome.  Nerve conduction is positive, nonresponsive to conservative treatment and has developed triggering of her right middle finger.  This has not responded to conservative treatment.  She has elected to undergo release of the carpal canal along with release of the A1 pulley of the right middle finger.  Pre, peri, and postoperative course have been discussed along with risks and complications.  She is aware that there is no guarantee with the surgery; possibility of infection; recurrence of injury to arteries, nerves, tendons; incomplete relief of symptoms and dystrophy.  In the preoperative area, the patient was seen, the extremity marked by both patient and surgeon, and antibiotic given.  DESCRIPTION OF PROCEDURE:  The patient was brought to the operating room where a forearm-based IV regional anesthetic was carried out without difficulty under the direction of Dr. Krista BlueSinger.  She was prepped using ChloraPrep, supine position with the right arm free.  A 3-minute dry time was  allowed.  Time-out taken, confirming the patient and procedure. The middle finger was attended to first.  An oblique incision was made over the A1 pulley area of the right middle finger, carried down through the subcutaneous tissue.  Bleeders were electrocauterized with bipolar. The dissection was carried down to the A1 pulley.  Retractor was placed, protecting the neurovascular structures.  The A1 pulley was released on its radial aspect.  A small incision was made centrally in A2.  A very significant proliferation of synovial tissue was present, this was excised over the entire course of the incision.  Two tendons were separated, they were adherent together.  Finger was placed through a full range motion, no further triggering was noted.  The wound was irrigated and the skin closed with interrupted 4-0 nylon sutures.  A separate incision was then made over the flexor retinaculum, again carried down through the subcutaneous tissue.  Bleeders were again electrocauterized.  The dissection was carried down separating the palmar fascia.  The superficial palmar arch was identified.  The flexor tendon to the ring and little finger identified.  Retractor was placed protecting the medial and ulnar nerve.  Flexor retinaculum was then incised on its ulnar border.  A right angle and Sewall retractor were placed between the skin and  forearm fascia.  The fascia was released for approximately 1.5 cm to 2 cm under direct vision.  Canal was explored. Air compression to the nerve was apparent.  Motor branch entered into muscle distally and no further lesions were identified.  The wound was copiously irrigated with saline and the skin closed with interrupted 4-0 nylon suture.  A local infiltration with 0.25% bupivacaine without epinephrine was given, approximately 9 mL was used.  A sterile compressive dressing was applied with the fingers free.  On deflation of the tourniquet, all fingers were immediately  pinked.  She was taken to the recovery room for observation in satisfactory condition.  She will be discharged to home [facility], on [place].          ______________________________ Cindee SaltGary Kaesyn Johnston, M.D.     GK/MEDQ  D:  02/14/2015  T:  02/14/2015  Job:  [id number]

## 2015-02-16 ENCOUNTER — Encounter (HOSPITAL_BASED_OUTPATIENT_CLINIC_OR_DEPARTMENT_OTHER): Payer: Self-pay | Admitting: Orthopedic Surgery

## 2015-12-14 ENCOUNTER — Ambulatory Visit (INDEPENDENT_AMBULATORY_CARE_PROVIDER_SITE_OTHER): Payer: BC Managed Care – PPO | Admitting: Family Medicine

## 2015-12-14 ENCOUNTER — Encounter: Payer: Self-pay | Admitting: Family Medicine

## 2015-12-14 VITALS — BP 110/80 | HR 75 | Resp 16 | Ht 63.0 in | Wt 187.4 lb

## 2015-12-14 DIAGNOSIS — E8881 Metabolic syndrome: Secondary | ICD-10-CM

## 2015-12-14 DIAGNOSIS — E669 Obesity, unspecified: Secondary | ICD-10-CM

## 2015-12-14 DIAGNOSIS — Z23 Encounter for immunization: Secondary | ICD-10-CM

## 2015-12-14 DIAGNOSIS — R519 Headache, unspecified: Secondary | ICD-10-CM

## 2015-12-14 DIAGNOSIS — R51 Headache: Secondary | ICD-10-CM | POA: Diagnosis not present

## 2015-12-14 DIAGNOSIS — F32A Depression, unspecified: Secondary | ICD-10-CM

## 2015-12-14 DIAGNOSIS — G4700 Insomnia, unspecified: Secondary | ICD-10-CM | POA: Diagnosis not present

## 2015-12-14 DIAGNOSIS — Z1159 Encounter for screening for other viral diseases: Secondary | ICD-10-CM

## 2015-12-14 DIAGNOSIS — F411 Generalized anxiety disorder: Secondary | ICD-10-CM | POA: Diagnosis not present

## 2015-12-14 DIAGNOSIS — F329 Major depressive disorder, single episode, unspecified: Secondary | ICD-10-CM

## 2015-12-14 LAB — HEMOGLOBIN A1C
HEMOGLOBIN A1C: 5.8 % — AB (ref <5.7)
Mean Plasma Glucose: 120 mg/dL — ABNORMAL HIGH (ref <117)

## 2015-12-14 LAB — CBC WITH DIFFERENTIAL/PLATELET
BASOS ABS: 0 10*3/uL (ref 0.0–0.1)
Basophils Relative: 0 % (ref 0–1)
EOS PCT: 5 % (ref 0–5)
Eosinophils Absolute: 0.2 10*3/uL (ref 0.0–0.7)
HEMATOCRIT: 40 % (ref 36.0–46.0)
Hemoglobin: 13.9 g/dL (ref 12.0–15.0)
LYMPHS ABS: 1.7 10*3/uL (ref 0.7–4.0)
LYMPHS PCT: 34 % (ref 12–46)
MCH: 29.7 pg (ref 26.0–34.0)
MCHC: 34.8 g/dL (ref 30.0–36.0)
MCV: 85.5 fL (ref 78.0–100.0)
MONOS PCT: 6 % (ref 3–12)
MPV: 10.1 fL (ref 8.6–12.4)
Monocytes Absolute: 0.3 10*3/uL (ref 0.1–1.0)
NEUTROS PCT: 55 % (ref 43–77)
Neutro Abs: 2.7 10*3/uL (ref 1.7–7.7)
Platelets: 401 10*3/uL — ABNORMAL HIGH (ref 150–400)
RBC: 4.68 MIL/uL (ref 3.87–5.11)
RDW: 13.6 % (ref 11.5–15.5)
WBC: 4.9 10*3/uL (ref 4.0–10.5)

## 2015-12-14 LAB — COMPREHENSIVE METABOLIC PANEL
ALT: 31 U/L — ABNORMAL HIGH (ref 6–29)
AST: 19 U/L (ref 10–30)
Albumin: 4.2 g/dL (ref 3.6–5.1)
Alkaline Phosphatase: 72 U/L (ref 33–115)
BILIRUBIN TOTAL: 0.7 mg/dL (ref 0.2–1.2)
BUN: 9 mg/dL (ref 7–25)
CO2: 28 mmol/L (ref 20–31)
CREATININE: 0.63 mg/dL (ref 0.50–1.10)
Calcium: 9.7 mg/dL (ref 8.6–10.2)
Chloride: 103 mmol/L (ref 98–110)
Glucose, Bld: 76 mg/dL (ref 65–99)
Potassium: 4.3 mmol/L (ref 3.5–5.3)
SODIUM: 140 mmol/L (ref 135–146)
TOTAL PROTEIN: 7.3 g/dL (ref 6.1–8.1)

## 2015-12-14 LAB — LIPID PANEL
Cholesterol: 199 mg/dL (ref 125–200)
HDL: 52 mg/dL (ref >=46)
LDL CALC: 136 mg/dL — AB (ref <130)
Total CHOL/HDL Ratio: 3.8 Ratio (ref <=5.0)
Triglycerides: 53 mg/dL (ref <150)
VLDL: 11 mg/dL (ref <30)

## 2015-12-14 LAB — TSH: TSH: 1 mIU/L

## 2015-12-14 LAB — HIV ANTIBODY (ROUTINE TESTING W REFLEX): HIV 1&2 Ab, 4th Generation: NONREACTIVE

## 2015-12-14 MED ORDER — TEMAZEPAM 30 MG PO CAPS: 30.0000 mg | ORAL_CAPSULE | Freq: Every evening | ORAL | Status: DC | PRN

## 2015-12-14 MED ORDER — FLUOXETINE HCL 20 MG PO TABS: 20.0000 mg | ORAL_TABLET | Freq: Every day | ORAL | Status: DC

## 2015-12-14 MED ORDER — TOPIRAMATE 100 MG PO TABS: 100.0000 mg | ORAL_TABLET | Freq: Every day | ORAL | Status: DC

## 2015-12-14 NOTE — Progress Notes (Signed)
Subjective:    Patient ID: Christine Fitzgerald, female    DOB: 1976-04-09, 40 y.o.   MRN: [id number]  HPI   Christine Fitzgerald     MRN: [id number]      DOB: Jan 30, 1976   HPI Christine Fitzgerald is here for follow up and re-evaluation of chronic medical conditions, medication management and review of any available recent lab and radiology data.  Preventive health is updated, specifically  Cancer screening and Immunization.   Questions or concerns regarding consultations or procedures which the PT has had in the interim are  Addressed.Had carpal tunnel release in 2016, improved as a result The PT denies any adverse reactions to current medications since the last visit.  C/o increased and uncontrolled migraine headaches. Had been on topamax, but out of med, also states she had been seeing neurology, and had behavioral management of stress, which also improved control, wants to return  ROS Denies recent fever or chills. Denies sinus pressure, nasal congestion, ear pain or sore throat. Denies chest congestion, productive cough or wheezing. Denies chest pains, palpitations and leg swelling Denies abdominal pain, nausea, vomiting,diarrhea or constipation.   Denies dysuria, frequency, hesitancy or incontinence. Denies joint pain, swelling and limitation in mobility. Denies headaches, seizures, numbness, or tingling. Denies  uncontrolled depression, does report increased  anxiety and  Insomnia in past 4 to 6 weeks, now has increased responsibility on job with training and notes lack of control.Has been out of medication for past 2 monhts, wants to resume, does not feel the need to return to therapy at this time She has in the interim been to professional development classes which have been benficial Denies skin break down or rash.   PE  BP 110/80 mmHg  Pulse 75  Resp 16  Ht  (1.6 m)  Wt 187 lb 6.4 oz (85.004 kg)  BMI 33.20 kg/m2  SpO2 99%  Patient alert and oriented and in no cardiopulmonary  distress.  HEENT: No facial asymmetry, EOMI,   oropharynx pink and moist.  Neck supple no JVD, no mass.  Chest: Clear to auscultation bilaterally.  CVS: S1, S2 no murmurs, no S3.Regular rate.  ABD: Soft non tender.   Ext: No edema  MS: Adequate ROM spine, shoulders, hips and knees.  Skin: Intact, no ulcerations or rash noted.  Psych: Good eye contact, normal affect. Memory intact not anxious or depressed appearing.  CNS: CN 2-12 intact, power,  normal throughout.no focal deficits noted.   Assessment & Plan   Obesity (BMI 30.0-34.9) Deteriorated. Patient re-educated about  the importance of commitment to a  minimum of 150 minutes of exercise per week.  The importance of healthy food choices with portion control discussed. Encouraged to start a food diary, count calories and to consider  joining a support group. Sample diet sheets offered. Goals set by the patient for the next several months.   Weight /BMI 12/14/2015 02/14/2015 02/16/2014  WEIGHT 187 lb 6.4 oz 185 lb 8 oz 177 lb 0.6 oz  HEIGHT   5' 4.5"  BMI 33.2 kg/m2 32.87 kg/m2 29.93 kg/m2  Some encounter information is confidential and restricted. Go to Review Flowsheets activity to see all data.    Current exercise per week 90  minutes.   Headache disorder Uncontrolled , increased frequency and intractable. Resume daily topamax, and refer to headache clinic where she reports benefiting in the past from behavioral ed  GAD (generalized anxiety disorder) needs to resume fluoxetine, notes deterioration in anxiety level in  past 4 to 6 weeks  Depression Improved, overall situation has improved financially and professionally, however, will resume fluoxetine ans emotional and mental health benefits with medication  Metabolic syndrome X The increased risk of cardiovascular disease associated with this diagnosis, and the need to consistently work on lifestyle to change this is discussed. Following  a  heart healthy  diet ,commitment to 30 minutes of exercise at least 5 days per week, as well as control of blood sugar and cholesterol , and achieving a healthy weight are all the areas to be addressed .   Insomnia Sleep hygiene reviewed and written information offered also. Prescription sent for  medication needed.   Need for prophylactic vaccination and inoculation against influenza After obtaining informed consent, the vaccine is  administered by LPN.        Review of Systems     Objective:   Physical Exam        Assessment & Plan:

## 2015-12-14 NOTE — Assessment & Plan Note (Signed)
Deteriorated. Patient re-educated about  the importance of commitment to a  minimum of 150 minutes of exercise per week.  The importance of healthy food choices with portion control discussed. Encouraged to start a food diary, count calories and to consider  joining a support group. Sample diet sheets offered. Goals set by the patient for the next several months.   Weight /BMI 12/14/2015 02/14/2015 02/16/2014  WEIGHT 187 lb 6.4 oz 185 lb 8 oz 177 lb 0.6 oz  HEIGHT   5' 4.5"  BMI 33.2 kg/m2 32.87 kg/m2 29.93 kg/m2  Some encounter information is confidential and restricted. Go to Review Flowsheets activity to see all data.    Current exercise per week 90  minutes.

## 2015-12-14 NOTE — Patient Instructions (Addendum)
F/U in 4 months, call if you need me before  Fasting lipid, cmp, hBAS1c, TSH, hIV, cbc  Please work on good  health habits so that your health will improve. 1. Commitment to daily physical activity for 30 to 60  minutes, if you are able to do this.  2. Commitment to wise food choices. Aim for half of your  food intake to be vegetable and fruit, one quarter starchy foods, and one quarter protein. Try to eat on a regular schedule  3 meals per day, snacking between meals should be limited to vegetables or fruits or small portions of nuts. 64 ounces of water per day is generally recommended, unless you have specific health conditions, like heart failure or kidney failure where you will need to limit fluid intake.  3. Commitment to sufficient and a  good quality of physical and mental rest daily, generally between 6 to 8 hours per day.  WITH PERSISTANCE AND PERSEVERANCE, THE IMPOSSIBLE , BECOMES THE NORM!   New medications sent in as discussed 3  Pls contact gyne re contraception  You are referred to neurology   Insomnia Insomnia is a sleep disorder that makes it difficult to fall asleep or to stay asleep. Insomnia can cause tiredness (fatigue), low energy, difficulty concentrating, mood swings, and poor performance at work or school.  There are three different ways to classify insomnia:  Difficulty falling asleep.  Difficulty staying asleep.  Waking up too early in the morning. Any type of insomnia can be long-term (chronic) or short-term (acute). Both are common. Short-term insomnia usually lasts for three months or less. Chronic insomnia occurs at least three times a week for longer than three months. CAUSES  Insomnia may be caused by another condition, situation, or substance, such as:  Anxiety.  Certain medicines.  Gastroesophageal reflux disease (GERD) or other gastrointestinal conditions.  Asthma or other breathing conditions.  Restless legs syndrome, sleep apnea, or  other sleep disorders.  Chronic pain.  Menopause. This may include hot flashes.  Stroke.  Abuse of alcohol, tobacco, or illegal drugs.  Depression.  Caffeine.   Neurological disorders, such as Alzheimer disease.  An overactive thyroid (hyperthyroidism). The cause of insomnia may not be known. RISK FACTORS Risk factors for insomnia include:  Gender. Women are more commonly affected than men.  Age. Insomnia is more common as you get older.  Stress. This may involve your professional or personal life.  Income. Insomnia is more common in people with lower income.  Lack of exercise.   Irregular work schedule or night shifts.  Traveling between different time zones. SIGNS AND SYMPTOMS If you have insomnia, trouble falling asleep or trouble staying asleep is the main symptom. This may lead to other symptoms, such as:  Feeling fatigued.  Feeling nervous about going to sleep.  Not feeling rested in the morning.  Having trouble concentrating.  Feeling irritable, anxious, or depressed. TREATMENT  Treatment for insomnia depends on the cause. If your insomnia is caused by an underlying condition, treatment will focus on addressing the condition. Treatment may also include:   Medicines to help you sleep.  Counseling or therapy.  Lifestyle adjustments. HOME CARE INSTRUCTIONS   Take medicines only as directed by your health care provider.  Keep regular sleeping and waking hours. Avoid naps.  Keep a sleep diary to help you and your health care provider figure out what could be causing your insomnia. Include:   When you sleep.  When you wake up during the night.  How well you sleep.   How rested you feel the next day.  Any side effects of medicines you are taking.  What you eat and drink.   Make your bedroom a comfortable place where it is easy to fall asleep:  Put up shades or special blackout curtains to block light from outside.  Use a white noise  machine to block noise.  Keep the temperature cool.   Exercise regularly as directed by your health care provider. Avoid exercising right before bedtime.  Use relaxation techniques to manage stress. Ask your health care provider to suggest some techniques that may work well for you. These may include:  Breathing exercises.  Routines to release muscle tension.  Visualizing peaceful scenes.  Cut back on alcohol, caffeinated beverages, and cigarettes, especially close to bedtime. These can disrupt your sleep.  Do not overeat or eat spicy foods right before bedtime. This can lead to digestive discomfort that can make it hard for you to sleep.  Limit screen use before bedtime. This includes:  Watching TV.  Using your smartphone, tablet, and computer.  Stick to a routine. This can help you fall asleep faster. Try to do a quiet activity, brush your teeth, and go to bed at the same time each night.  Get out of bed if you are still awake after 15 minutes of trying to sleep. Keep the lights down, but try reading or doing a quiet activity. When you feel sleepy, go back to bed.  Make sure that you drive carefully. Avoid driving if you feel very sleepy.  Keep all follow-up appointments as directed by your health care provider. This is important. SEEK MEDICAL CARE IF:   You are tired throughout the day or have trouble in your daily routine due to sleepiness.  You continue to have sleep problems or your sleep problems get worse. SEEK IMMEDIATE MEDICAL CARE IF:   You have serious thoughts about hurting yourself or someone else.   This information is not intended to replace advice given to you by your health care provider. Make sure you discuss any questions you have with your health care provider.   Document Released: 09/27/2000 Document Revised: 06/21/2015 Document Reviewed: 07/01/2014 Elsevier Interactive Patient Education [employer]

## 2015-12-17 DIAGNOSIS — Z23 Encounter for immunization: Secondary | ICD-10-CM | POA: Insufficient documentation

## 2015-12-17 NOTE — Assessment & Plan Note (Signed)
needs to resume fluoxetine, notes deterioration in anxiety level in past 4 to 6 weeks

## 2015-12-17 NOTE — Assessment & Plan Note (Signed)
After obtaining informed consent, the vaccine is  administered by LPN.  

## 2015-12-17 NOTE — Assessment & Plan Note (Signed)
Sleep hygiene reviewed and written information offered also. Prescription sent for  medication needed.  

## 2015-12-17 NOTE — Assessment & Plan Note (Signed)
Uncontrolled , increased frequency and intractable. Resume daily topamax, and refer to headache clinic where she reports benefiting in the past from behavioral ed

## 2015-12-17 NOTE — Assessment & Plan Note (Signed)
Improved, overall situation has improved financially and professionally, however, will resume fluoxetine ans emotional and mental health benefits with medication

## 2015-12-17 NOTE — Assessment & Plan Note (Signed)
The increased risk of cardiovascular disease associated with this diagnosis, and the need to consistently work on lifestyle to change this is discussed. Following  a  heart healthy diet ,commitment to 30 minutes of exercise at least 5 days per week, as well as control of blood sugar and cholesterol , and achieving a healthy weight are all the areas to be addressed .  

## 2016-02-01 ENCOUNTER — Telehealth: Payer: Self-pay

## 2016-02-01 NOTE — Telephone Encounter (Signed)
Called and left message for patient to return call.  

## 2016-02-01 NOTE — Telephone Encounter (Signed)
With abdominal pain of unclear etiology, i recommend urgent care nearest her, pls let her know I am out of offic this pm so can't see her today, needs clinical eval today

## 2016-02-02 ENCOUNTER — Ambulatory Visit (INDEPENDENT_AMBULATORY_CARE_PROVIDER_SITE_OTHER): Payer: BC Managed Care – PPO

## 2016-02-02 ENCOUNTER — Ambulatory Visit (INDEPENDENT_AMBULATORY_CARE_PROVIDER_SITE_OTHER): Payer: BC Managed Care – PPO | Admitting: Family Medicine

## 2016-02-02 VITALS — BP 110/68 | HR 72 | Temp 98.1°F | Resp 18 | Ht 64.0 in | Wt 177.0 lb

## 2016-02-02 DIAGNOSIS — R1011 Right upper quadrant pain: Secondary | ICD-10-CM

## 2016-02-02 DIAGNOSIS — Z3200 Encounter for pregnancy test, result unknown: Secondary | ICD-10-CM

## 2016-02-02 LAB — POCT CBC
Granulocyte percent: 65.1 %G (ref 37–80)
HCT, POC: 37.7 % (ref 37.7–47.9)
Hemoglobin: 13.3 g/dL (ref 12.2–16.2)
Lymph, poc: 1.5 (ref 0.6–3.4)
MCH, POC: 30.1 pg (ref 27–31.2)
MCHC: 35.4 g/dL (ref 31.8–35.4)
MCV: 85.2 fL (ref 80–97)
MID (cbc): 0.2 (ref 0–0.9)
MPV: 7.9 fL (ref 0–99.8)
POC Granulocyte: 3.1 (ref 2–6.9)
POC LYMPH PERCENT: 31.2 %L (ref 10–50)
POC MID %: 3.7 %M (ref 0–12)
Platelet Count, POC: 288 10*3/uL (ref 142–424)
RBC: 4.43 M/uL (ref 4.04–5.48)
RDW, POC: 13.2 %
WBC: 4.8 10*3/uL (ref 4.6–10.2)

## 2016-02-02 LAB — POCT URINALYSIS DIP (MANUAL ENTRY)
Bilirubin, UA: NEGATIVE
Blood, UA: NEGATIVE
Glucose, UA: NEGATIVE
Ketones, POC UA: NEGATIVE
Leukocytes, UA: NEGATIVE
Nitrite, UA: NEGATIVE
Spec Grav, UA: 1.02
Urobilinogen, UA: 1
pH, UA: 7

## 2016-02-02 LAB — POCT URINE PREGNANCY: Preg Test, Ur: NEGATIVE

## 2016-02-02 MED ORDER — DICLOFENAC SODIUM 75 MG PO TBEC: 75.0000 mg | DELAYED_RELEASE_TABLET | Freq: Two times a day (BID) | ORAL | Status: DC

## 2016-02-02 MED ORDER — DOCUSATE SODIUM 100 MG PO CAPS: 100.0000 mg | ORAL_CAPSULE | Freq: Two times a day (BID) | ORAL | Status: DC

## 2016-02-02 NOTE — Patient Instructions (Addendum)
The x-rays and physical exam are consistent with a bruised rib. In addition, there is some constipation in that region. Therefore ordered an anti-inflammatory for the rib and a stool softener to help with the constipation.    IF you received an x-ray today, you will receive an invoice from [facility]. Please contact [facility] at [phone] with questions or concerns regarding your invoice.   IF you received labwork today, you will receive an invoice from [facility]. Please contact [facility] at [phone] with questions or concerns regarding your invoice.   Our billing staff will not be able to assist you with questions regarding bills from these companies.  You will be contacted with the lab results as soon as they are available. The fastest way to get your results is to activate your My Chart account. Instructions are located on the last page of this paperwork. If you have not heard from us regarding the results in 2 weeks, please contact this office.

## 2016-02-02 NOTE — Progress Notes (Signed)
This is a 40 year old woman who has right upper quadrant pain. It began on Tuesday (3 days ago) when her daughter was sick and was resting on her abdomen. Originally she thought she bruised her ribs.  Patient's had no nausea, vomiting, or diarrhea. She's also denies fever. She's never had this kind of pain before. She denies a rash in the area.  Patient has had a tubal ligation.  Patient says that the pain is worse when she tries to sit up or when she gets up from sitting in a chair. She has not had pain when she raises her leg however. Eating food does not change the nature of the pain. It is constant with occasional sharp stabbing kinds of pain.  Objective:  BP 110/68 mmHg  Pulse 72  Temp(Src) 98.1 F (36.7 C) (Oral)  Resp 18  Ht 5\' 4"  (1.626 m)  Wt 177 lb (80.287 kg)  BMI 30.37 kg/m2  SpO2 99%  LMP 12/26/2015 Patient's in no acute distress lying comfortably in the exam room Chest: Clear Heart: Regular no murmur Abdomen: Soft and nontender with the exception of palpation of the right lower anterior rib margin which is quite tender. Skin: Normal Extremity: No edema, good range of motion of the legs with negative straight leg raising  [facility] reading (PRIMARY) by  Dr. Milus GlazierLauenstein: heavy stool burden, o/w negative Results for orders placed or performed in visit on 02/02/16  POCT CBC  Result Value Ref Range   WBC 4.8 4.6 - 10.2 K/uL   Lymph, poc 1.5 0.6 - 3.4   POC LYMPH PERCENT 31.2 10 - 50 %L   MID (cbc) 0.2 0 - 0.9   POC MID % 3.7 0 - 12 %M   POC Granulocyte 3.1 2 - 6.9   Granulocyte percent 65.1 37 - 80 %G   RBC 4.43 4.04 - 5.48 M/uL   Hemoglobin 13.3 12.2 - 16.2 g/dL   HCT, POC 16.137.7 09.637.7 - 47.9 %   MCV 85.2 80 - 97 fL   MCH, POC 30.1 27 - 31.2 pg   MCHC 35.4 31.8 - 35.4 g/dL   RDW, POC 04.513.2 %   Platelet Count, POC 288 142 - 424 K/uL   MPV 7.9 0 - 99.8 fL  POCT urinalysis dipstick  Result Value Ref Range   Color, UA yellow yellow   Clarity, UA clear clear   Glucose, UA  negative negative   Bilirubin, UA negative negative   Ketones, POC UA negative negative   Spec Grav, UA 1.020    Blood, UA negative negative   pH, UA 7.0    Protein Ur, POC trace (A) negative   Urobilinogen, UA 1.0    Nitrite, UA Negative Negative   Leukocytes, UA Negative Negative  POCT urine pregnancy  Result Value Ref Range   Preg Test, Ur Negative Negative   Assessment: I believe patient has a bruised rib. The constipation doesn't help either.  Plan: Voltaren and Colace. Abdominal pain, right upper quadrant - Plan: DG Abd 1 View, POCT CBC, POCT urinalysis dipstick, POCT urine pregnancy, diclofenac (VOLTAREN) 75 MG EC tablet, docusate sodium (COLACE) 100 MG capsule  Encounter for pregnancy test - Plan: POCT urine pregnancy    Signed, Sheila OatsKurt Alga Southall M.D.

## 2016-02-02 NOTE — Telephone Encounter (Signed)
Patient aware.

## 2016-02-05 ENCOUNTER — Telehealth: Payer: Self-pay

## 2016-02-05 NOTE — Telephone Encounter (Signed)
Patient is calling to follow up about getting a work note. She will call back and give us the fax information incase the note isn't ready by today.

## 2016-02-05 NOTE — Telephone Encounter (Signed)
Pt states the medicine she was given is causing her to have diarrhea really bad and she has had to go to the bathroom all morning. Would like to have an extension from work for today Please call [phone] and she will come pick up

## 2016-02-06 NOTE — Telephone Encounter (Signed)
Patient went back to work on Today.  Requesting Dr. Elbert EwingsL to extend the note - to return to work on the 25th.   [phone]

## 2016-02-06 NOTE — Telephone Encounter (Signed)
Pt called again regarding her work note, states she really need to know something today. Please call [phone]

## 2016-02-07 NOTE — Telephone Encounter (Signed)
LM note ready.

## 2016-04-02 DIAGNOSIS — M542 Cervicalgia: Secondary | ICD-10-CM | POA: Diagnosis not present

## 2016-04-02 DIAGNOSIS — M791 Myalgia: Secondary | ICD-10-CM | POA: Diagnosis not present

## 2016-04-02 DIAGNOSIS — G43019 Migraine without aura, intractable, without status migrainosus: Secondary | ICD-10-CM | POA: Diagnosis not present

## 2016-04-02 DIAGNOSIS — G518 Other disorders of facial nerve: Secondary | ICD-10-CM | POA: Diagnosis not present

## 2016-04-15 ENCOUNTER — Ambulatory Visit (INDEPENDENT_AMBULATORY_CARE_PROVIDER_SITE_OTHER): Payer: BC Managed Care – PPO | Admitting: Family Medicine

## 2016-04-15 ENCOUNTER — Encounter: Payer: Self-pay | Admitting: Family Medicine

## 2016-04-15 VITALS — BP 120/76 | HR 76 | Resp 18 | Ht 63.0 in | Wt 181.0 lb

## 2016-04-15 DIAGNOSIS — F32A Depression, unspecified: Secondary | ICD-10-CM

## 2016-04-15 DIAGNOSIS — R7302 Impaired glucose tolerance (oral): Secondary | ICD-10-CM

## 2016-04-15 DIAGNOSIS — F411 Generalized anxiety disorder: Secondary | ICD-10-CM

## 2016-04-15 DIAGNOSIS — F329 Major depressive disorder, single episode, unspecified: Secondary | ICD-10-CM | POA: Diagnosis not present

## 2016-04-15 DIAGNOSIS — E669 Obesity, unspecified: Secondary | ICD-10-CM

## 2016-04-15 DIAGNOSIS — E784 Other hyperlipidemia: Secondary | ICD-10-CM

## 2016-04-15 DIAGNOSIS — Z13220 Encounter for screening for lipoid disorders: Secondary | ICD-10-CM | POA: Diagnosis not present

## 2016-04-15 DIAGNOSIS — R7303 Prediabetes: Secondary | ICD-10-CM

## 2016-04-15 DIAGNOSIS — E785 Hyperlipidemia, unspecified: Secondary | ICD-10-CM

## 2016-04-15 DIAGNOSIS — R519 Headache, unspecified: Secondary | ICD-10-CM

## 2016-04-15 DIAGNOSIS — R51 Headache: Secondary | ICD-10-CM

## 2016-04-15 DIAGNOSIS — G4700 Insomnia, unspecified: Secondary | ICD-10-CM

## 2016-04-15 DIAGNOSIS — E8881 Metabolic syndrome: Secondary | ICD-10-CM

## 2016-04-15 NOTE — Patient Instructions (Signed)
F/u in 4 month, call if you need me before  CONGRATS on improved health, keep it up    Fasting lipid, HBa1C   Please work on good  health habits so that your health will improve. 1. Commitment to daily physical activity for 30 to 60  minutes, if you are able to do this.  2. Commitment to wise food choices. Aim for half of your  food intake to be vegetable and fruit, one quarter starchy foods, and one quarter protein. Try to eat on a regular schedule  3 meals per day, snacking between meals should be limited to vegetables or fruits or small portions of nuts. 64 ounces of water per day is generally recommended, unless you have specific health conditions, like heart failure or kidney failure where you will need to limit fluid intake.  3. Commitment to sufficient and a  good quality of physical and mental rest daily, generally between 6 to 8 hours per day.  WITH PERSISTANCE AND PERSEVERANCE, THE IMPOSSIBLE , BECOMES THE NORM!  Thank you  for choosing [facility]. We consider it a privelige to serve you.  Delivering excellent health care in a caring and  compassionate way is our goal.  Partnering with you,  so that together we can achieve this goal is our strategy.

## 2016-04-16 DIAGNOSIS — E785 Hyperlipidemia, unspecified: Secondary | ICD-10-CM | POA: Insufficient documentation

## 2016-04-16 DIAGNOSIS — R7303 Prediabetes: Secondary | ICD-10-CM | POA: Insufficient documentation

## 2016-04-16 NOTE — Assessment & Plan Note (Signed)
Sleep hygiene reviewed and written information offered also. Prescription sent for  medication needed. Needs to commit to taking med daily, already has good hygiene, was unclear as to how to take it along with her other night time med

## 2016-04-16 NOTE — Assessment & Plan Note (Signed)
Hyperlipidemia:Low fat diet discussed and encouraged.   Lipid Panel  Lab Results  Component Value Date   CHOL 199 12/14/2015   HDL 52 12/14/2015   LDLCALC 136* 12/14/2015   TRIG 53 12/14/2015   CHOLHDL 3.8 12/14/2015   Updated lab needed at/ before next visit.

## 2016-04-16 NOTE — Assessment & Plan Note (Signed)
The increased risk of cardiovascular disease associated with this diagnosis, and the need to consistently work on lifestyle to change this is discussed. Following  a  heart healthy diet ,commitment to 30 minutes of exercise at least 5 days per week, as well as control of blood sugar and cholesterol , and achieving a healthy weight are all the areas to be addressed .  

## 2016-04-16 NOTE — Assessment & Plan Note (Signed)
Marked improvement clinically , pt states that she is "now living" No medication intolerance noted, continue medication and lifestyle changes discussed and encouraged, she is doing very well with stress management , and enjoying simple things like time spent with her daughter

## 2016-04-16 NOTE — Assessment & Plan Note (Signed)
Controlled, no change in medication Continue daily exercise and conscious relaxation

## 2016-04-16 NOTE — Assessment & Plan Note (Signed)
Patient educated about the importance of limiting  Carbohydrate intake , the need to commit to daily physical activity for a minimum of 30 minutes , and to commit weight loss. The fact that changes in all these areas will reduce or eliminate all together the development of diabetes is stressed.  Updated lab needed at/ before next visit.   Diabetic Labs Latest Ref Rng 12/14/2015 08/08/2013 01/29/2009  HbA1c <5.7 % 5.8(H) 5.2 -  Chol 125 - 200 mg/dL 604199 [id number] -  HDL >=98>=46 mg/dL 52 46 -  Calc LDL <119<130 mg/dL 147(W136(H) 295(A114(H) -  Triglycerides <150 mg/dL 53 35 -  Creatinine 2.130.50 - 1.10 mg/dL 0.860.63 5.780.69 0.9   BP/Weight 04/15/2016 02/02/2016 12/14/2015 02/14/2015 02/16/2014 01/20/2014 12/30/2013  Systolic BP 120 110 110 114 122 103 130  Diastolic BP 76 68 80 61 80 63 68  Wt. (Lbs) 181 177 187.4 185.5 177.04 - 172.12  BMI 32.07 30.37 33.2 32.87 29.93 - 29.1  Some encounter information is confidential and restricted. Go to Review Flowsheets activity to see all data.   No flowsheet data found.

## 2016-04-16 NOTE — Progress Notes (Signed)
Subjective:    Patient ID: Christine Fitzgerald, female    DOB: [birth year], 40 y.o.   MRN: [id number]  HPI   Christine Fitzgerald     MRN: [id number]      DOB: [birth year]   HPI Ms. Riess is here for follow up and re-evaluation of chronic medical conditions, medication management and review of any available recent lab and radiology data.  Preventive health is updated, specifically  Cancer screening and Immunization.   Questions or concerns regarding consultations or procedures which the PT has had in the interim are  addressed. The PT denies any adverse reactions to current medications since the last visit.  There are no new concerns.  There are no specific complaints   ROS Denies recent fever or chills. Denies sinus pressure, nasal congestion, ear pain or sore throat. Denies chest congestion, productive cough or wheezing. Denies chest pains, palpitations and leg swelling Denies abdominal pain, nausea, vomiting,diarrhea or constipation.   Denies dysuria, frequency, hesitancy or incontinence. Denies joint pain, swelling and limitation in mobility. Denies headaches, seizures, numbness, or tingling. Denies depression, anxiety or insomnia. Denies skin break down or rash.   PE  BP 120/76 mmHg  Pulse 76  Resp 18  Ht 5\' 3"  (1.6 m)  Wt 181 lb (82.101 kg)  BMI 32.07 kg/m2  SpO2 98%  Patient alert and oriented and in no cardiopulmonary distress.  HEENT: No facial asymmetry, EOMI,   oropharynx pink and moist.  Neck supple no JVD, no mass.  Chest: Clear to auscultation bilaterally.  CVS: S1, S2 no murmurs, no S3.Regular rate.  ABD: Soft non tender.   Ext: No edema  MS: Adequate ROM spine, shoulders, hips and knees.  Skin: Intact, no ulcerations or rash noted.  Psych: Good eye contact, normal affect. Memory intact not anxious or depressed appearing.  CNS: CN 2-12 intact, power,  normal throughout.no focal deficits noted.   Assessment & Plan  Depression Marked improvement clinically , pt  states that she is "now living" No medication intolerance noted, continue medication and lifestyle changes discussed and encouraged, she is doing very well with stress management , and enjoying simple things like time spent with her daughter  GAD (generalized anxiety disorder) Controlled, no change in medication Continue daily exercise and conscious relaxation  Obesity (BMI 30.0-34.9) Improved Patient re-educated about  the importance of commitment to a  minimum of 150 minutes of exercise per week.  The importance of healthy food choices with portion control discussed. Encouraged to start a food diary, count calories and to consider  joining a support group. Sample diet sheets offered. Goals set by the patient for the next several months.   Weight /BMI 04/15/2016 02/02/2016 12/14/2015  WEIGHT 181 lb 177 lb 187 lb 6.4 oz  HEIGHT 5\' 3"  5\' 4"  5\' 3"   BMI 32.07 kg/m2 30.37 kg/m2 33.2 kg/m2       Insomnia Sleep hygiene reviewed and written information offered also. Prescription sent for  medication needed. Needs to commit to taking med daily, already has good hygiene, was unclear as to how to take it along with her other night time med  Headache disorder Improved control on current regime by neurology , has a;lso had trigger point injections  Prediabetes Patient educated about the importance of limiting  Carbohydrate intake , the need to commit to daily physical activity for a minimum of 30 minutes , and to commit weight loss. The fact that changes in all these areas will reduce or eliminate all together  the development of diabetes is stressed.  Updated lab needed at/ before next visit.   Diabetic Labs Latest Ref Rng 12/14/2015 08/08/2013 01/29/2009  HbA1c <5.7 % 5.8(H) 5.2 -  Chol 125 - 200 mg/dL 409199 [id number] -  HDL >=91>=46 mg/dL 52 46 -  Calc LDL <478<130 mg/dL 295(A136(H) 213(Y114(H) -  Triglycerides <150 mg/dL 53 35 -  Creatinine 8.650.50 - 1.10 mg/dL 7.840.63 6.960.69 0.9   BP/Weight 04/15/2016 02/02/2016 12/14/2015  02/14/2015 02/16/2014 01/20/2014 12/30/2013  Systolic BP 120 110 110 114 122 103 130  Diastolic BP 76 68 80 61 80 63 68  Wt. (Lbs) 181 177 187.4 185.5 177.04 - 172.12  BMI 32.07 30.37 33.2 32.87 29.93 - 29.1  Some encounter information is confidential and restricted. Go to Review Flowsheets activity to see all data.   No flowsheet data found.     Metabolic syndrome X The increased risk of cardiovascular disease associated with this diagnosis, and the need to consistently work on lifestyle to change this is discussed. Following  a  heart healthy diet ,commitment to 30 minutes of exercise at least 5 days per week, as well as control of blood sugar and cholesterol , and achieving a healthy weight are all the areas to be addressed .   Dyslipidemia (high LDL; low HDL) Hyperlipidemia:Low fat diet discussed and encouraged.   Lipid Panel  Lab Results  Component Value Date   CHOL 199 12/14/2015   HDL 52 12/14/2015   LDLCALC 136* 12/14/2015   TRIG 53 12/14/2015   CHOLHDL 3.8 12/14/2015   Updated lab needed at/ before next visit.           Review of Systems     Objective:   Physical Exam        Assessment & Plan:

## 2016-04-16 NOTE — Assessment & Plan Note (Signed)
Improved Patient re-educated about  the importance of commitment to a  minimum of 150 minutes of exercise per week.  The importance of healthy food choices with portion control discussed. Encouraged to start a food diary, count calories and to consider  joining a support group. Sample diet sheets offered. Goals set by the patient for the next several months.   Weight /BMI 04/15/2016 02/02/2016 12/14/2015  WEIGHT 181 lb 177 lb 187 lb 6.4 oz  HEIGHT 5\' 3"  5\' 4"  5\' 3"   BMI 32.07 kg/m2 30.37 kg/m2 33.2 kg/m2

## 2016-04-16 NOTE — Assessment & Plan Note (Signed)
Improved control on current regime by neurology , has a;lso had trigger point injections

## 2016-04-25 IMAGING — CR DG ABDOMEN 1V
1 series · 1 of 1 positions shown · non-contrast
Comparison: None.

CLINICAL DATA: Right upper quadrant pain

EXAM:
ABDOMEN - 1 VIEW

[AP]
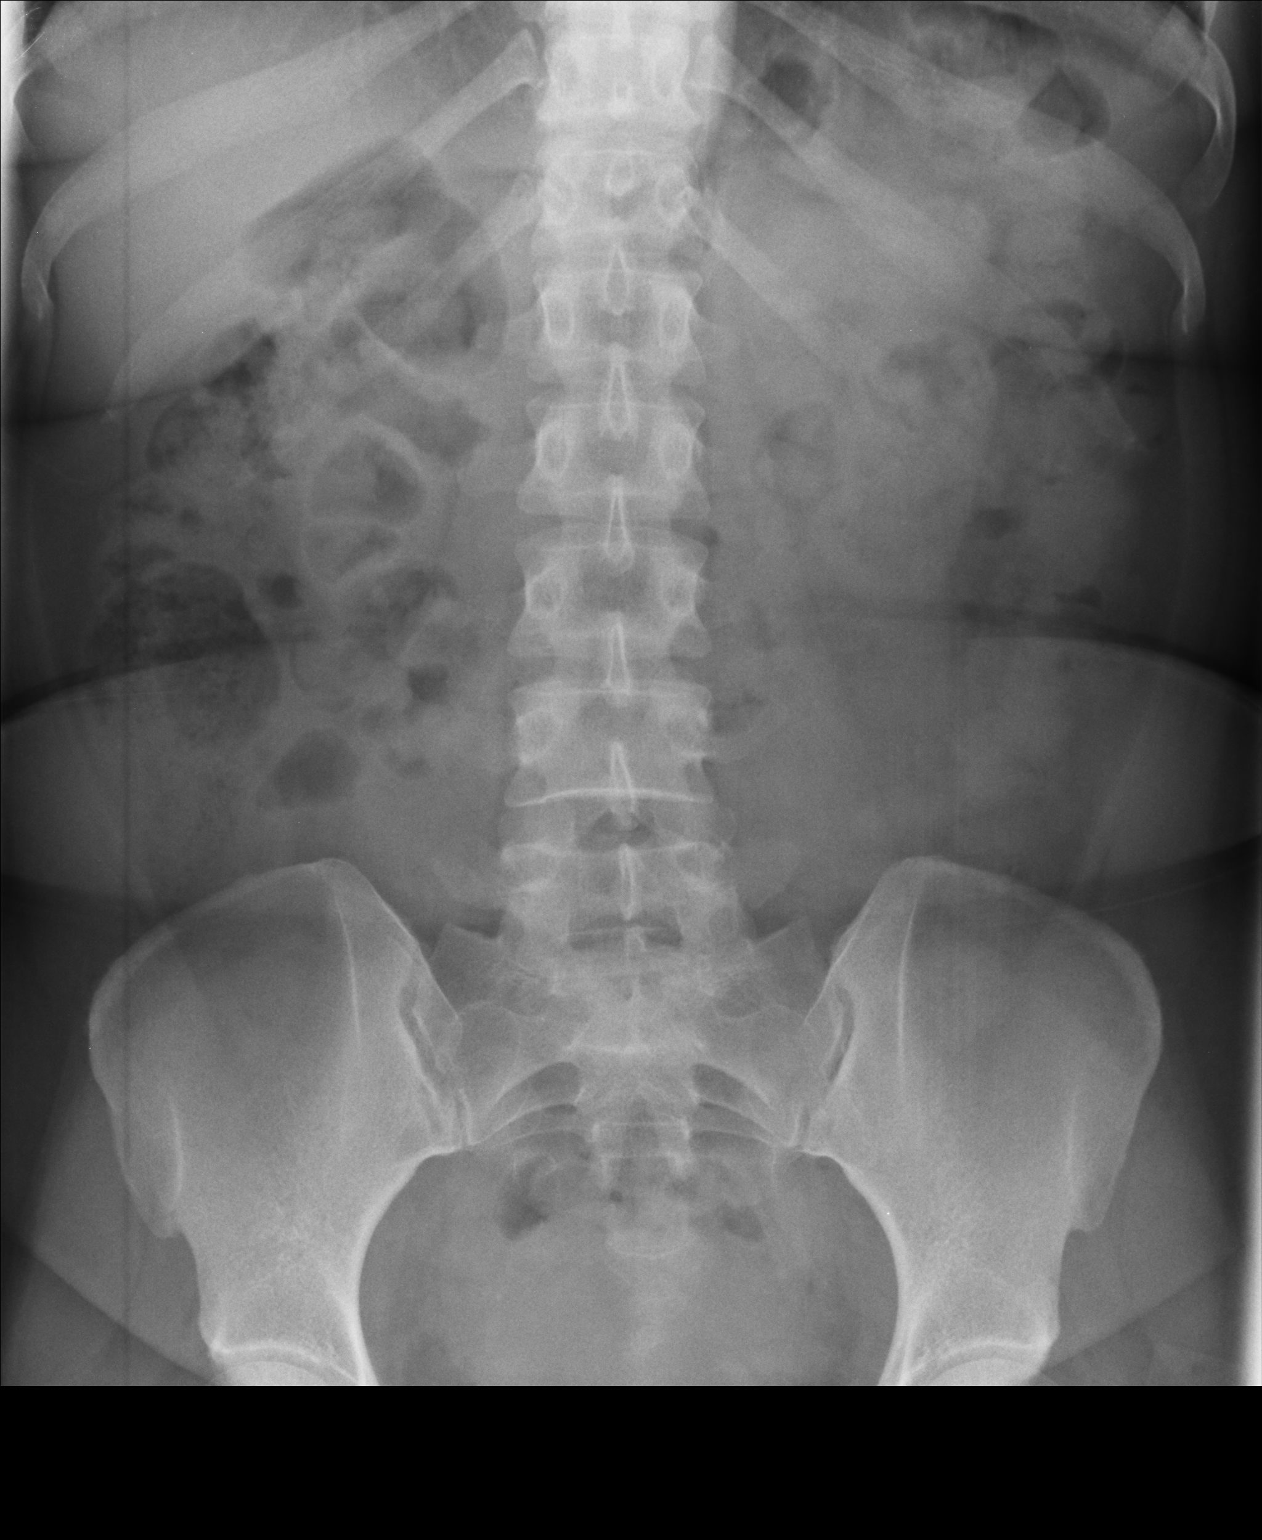

[1 of 1 positions shown; findings below may reference images not displayed]

FINDINGS: The bowel gas pattern is normal. No radio-opaque calculi or other
significant radiographic abnormality are seen.
IMPRESSION: Negative.

## 2016-05-23 ENCOUNTER — Other Ambulatory Visit: Payer: Self-pay | Admitting: Family Medicine

## 2016-05-23 DIAGNOSIS — F411 Generalized anxiety disorder: Secondary | ICD-10-CM

## 2016-06-19 ENCOUNTER — Encounter: Payer: Self-pay | Admitting: Family Medicine

## 2016-06-19 ENCOUNTER — Ambulatory Visit (INDEPENDENT_AMBULATORY_CARE_PROVIDER_SITE_OTHER): Payer: BC Managed Care – PPO | Admitting: Family Medicine

## 2016-06-19 VITALS — BP 112/80 | HR 88 | Resp 18 | Ht 63.0 in | Wt 184.1 lb

## 2016-06-19 DIAGNOSIS — R51 Headache: Secondary | ICD-10-CM

## 2016-06-19 DIAGNOSIS — R7303 Prediabetes: Secondary | ICD-10-CM

## 2016-06-19 DIAGNOSIS — F32A Depression, unspecified: Secondary | ICD-10-CM

## 2016-06-19 DIAGNOSIS — E784 Other hyperlipidemia: Secondary | ICD-10-CM

## 2016-06-19 DIAGNOSIS — E8881 Metabolic syndrome: Secondary | ICD-10-CM

## 2016-06-19 DIAGNOSIS — F329 Major depressive disorder, single episode, unspecified: Secondary | ICD-10-CM | POA: Diagnosis not present

## 2016-06-19 DIAGNOSIS — Z23 Encounter for immunization: Secondary | ICD-10-CM | POA: Diagnosis not present

## 2016-06-19 DIAGNOSIS — E66811 Obesity, class 1: Secondary | ICD-10-CM

## 2016-06-19 DIAGNOSIS — E669 Obesity, unspecified: Secondary | ICD-10-CM | POA: Diagnosis not present

## 2016-06-19 DIAGNOSIS — F411 Generalized anxiety disorder: Secondary | ICD-10-CM

## 2016-06-19 DIAGNOSIS — R519 Headache, unspecified: Secondary | ICD-10-CM

## 2016-06-19 DIAGNOSIS — G4700 Insomnia, unspecified: Secondary | ICD-10-CM

## 2016-06-19 DIAGNOSIS — E785 Hyperlipidemia, unspecified: Secondary | ICD-10-CM

## 2016-06-19 MED ORDER — FLUOXETINE HCL 20 MG PO TABS: 20.0000 mg | ORAL_TABLET | Freq: Every day | ORAL | 5 refills | Status: DC

## 2016-06-19 NOTE — Assessment & Plan Note (Signed)
Deteriorated. Patient re-educated about  the importance of commitment to a  minimum of 150 minutes of exercise per week.  The importance of healthy food choices with portion control discussed. Encouraged to start a food diary, count calories and to consider  joining a support group. Sample diet sheets offered. Goals set by the patient for the next several months.   Weight /BMI 06/19/2016 04/15/2016 02/02/2016  WEIGHT 184 lb 1.9 oz 181 lb 177 lb  HEIGHT 5\' 3"  5\' 3"  5\' 4"   BMI 32.62 kg/m2 32.07 kg/m2 30.37 kg/m2  Some encounter information is confidential and restricted. Go to Review Flowsheets activity to see all data.

## 2016-06-19 NOTE — Progress Notes (Signed)
Chontel Warning     MRN: [id number]      DOB: 11-01-1975   HPI Christine Fitzgerald is here for follow up and re-evaluation of chronic medical conditions, medication management and review of any available recent lab and radiology data.  Preventive health is updated, specifically  Cancer screening and Immunization.    The PT denies any adverse reactions to current medications since the last visit.  There are no new concerns.  There are no specific complaints  Feels burden lifted as she quit her job mid August, and will start job hunting in the near future  ROS Denies recent fever or chills. Denies sinus pressure, nasal congestion, ear pain or sore throat. Denies chest congestion, productive cough or wheezing. Denies chest pains, palpitations and leg swelling Denies abdominal pain, nausea, vomiting,diarrhea or constipation.   Denies dysuria, frequency, hesitancy or incontinence. Denies joint pain, swelling and limitation in mobility. Denies headaches, seizures, numbness, or tingling. Denies depression, anxiety or insomnia. Denies skin break down or rash.   PE  BP 112/80   Pulse 88   Resp 18   Ht 5\' 3"  (1.6 m)   Wt 184 lb 1.9 oz (83.5 kg)   LMP 06/03/2016 (Within Weeks)   SpO2 100%   BMI 32.62 kg/m   Patient alert and oriented and in no cardiopulmonary distress.  HEENT: No facial asymmetry, EOMI,   oropharynx pink and moist.  Neck supple no JVD, no mass.  Chest: Clear to auscultation bilaterally.  CVS: S1, S2 no murmurs, no S3.Regular rate.  ABD: Soft non tender.   Ext: No edema  MS: Adequate ROM spine, shoulders, hips and knees.  Skin: Intact, no ulcerations or rash noted.  Psych: Good eye contact, normal affect. Memory intact not anxious or depressed appearing.  CNS: CN 2-12 intact, power,  normal throughout.no focal deficits noted.   Assessment & Plan  Depression Controlled, no change in medication   Dyslipidemia (high LDL; low HDL) Updated lab needed at/ before next  visit. Hyperlipidemia:Low fat diet discussed and encouraged.   Lipid Panel  Lab Results  Component Value Date   CHOL 199 12/14/2015   HDL 52 12/14/2015   LDLCALC 136 (H) 12/14/2015   TRIG 53 12/14/2015   CHOLHDL 3.8 12/14/2015       GAD (generalized anxiety disorder) Controlled, no change in medication   Headache disorder Controlled, no change in medication Followed by neurology  Insomnia Improved uses medication only as needed, less than twice  Per week  Obesity (BMI 30.0-34.9) Deteriorated. Patient re-educated about  the importance of commitment to a  minimum of 150 minutes of exercise per week.  The importance of healthy food choices with portion control discussed. Encouraged to start a food diary, count calories and to consider  joining a support group. Sample diet sheets offered. Goals set by the patient for the next several months.   Weight /BMI 06/19/2016 04/15/2016 02/02/2016  WEIGHT 184 lb 1.9 oz 181 lb 177 lb  HEIGHT 5\' 3"  5\' 3"  5\' 4"   BMI 32.62 kg/m2 32.07 kg/m2 30.37 kg/m2  Some encounter information is confidential and restricted. Go to Review Flowsheets activity to see all data.      Prediabetes Patient educated about the importance of limiting  Carbohydrate intake , the need to commit to daily physical activity for a minimum of 30 minutes , and to commit weight loss. The fact that changes in all these areas will reduce or eliminate all together the development of diabetes is stressed.  Diabetic Labs Latest Ref Rng & Units 12/14/2015 08/08/2013 01/29/2009  HbA1c <5.7 % 5.8(H) 5.2 -  Chol 125 - 200 mg/dL 161199 096167 -  HDL >=04>=46 mg/dL 52 46 -  Calc LDL <540<130 mg/dL 981(X136(H) 914(N114(H) -  Triglycerides <150 mg/dL 53 35 -  Creatinine 8.290.50 - 1.10 mg/dL 5.620.63 1.300.69 0.9   BP/Weight 06/19/2016 04/15/2016 02/02/2016 12/14/2015 02/14/2015 02/16/2014 01/20/2014  Systolic BP 112 120 110 110 114 122 103  Diastolic BP 80 76 68 80 61 80 63  Wt. (Lbs) 184.12 181 177 187.4 185.5 177.04 -  BMI  32.62 32.07 30.37 33.2 32.87 29.93 -  Some encounter information is confidential and restricted. Go to Review Flowsheets activity to see all data.   No flowsheet data found.  Updated lab today   Metabolic syndrome X The increased risk of cardiovascular disease associated with this diagnosis, and the need to consistently work on lifestyle to change this is discussed. Following  a  heart healthy diet ,commitment to 30 minutes of exercise at least 5 days per week, as well as control of blood sugar and cholesterol , and achieving a healthy weight are all the areas to be addressed .   Need for prophylactic vaccination and inoculation against influenza After obtaining informed consent, the vaccine is  administered by LPN.

## 2016-06-19 NOTE — Assessment & Plan Note (Signed)
Patient educated about the importance of limiting  Carbohydrate intake , the need to commit to daily physical activity for a minimum of 30 minutes , and to commit weight loss. The fact that changes in all these areas will reduce or eliminate all together the development of diabetes is stressed.   Diabetic Labs Latest Ref Rng & Units 12/14/2015 08/08/2013 01/29/2009  HbA1c <5.7 % 5.8(H) 5.2 -  Chol 125 - 200 mg/dL 409199 811167 -  HDL >=91>=46 mg/dL 52 46 -  Calc LDL <478<130 mg/dL 295(A136(H) 213(Y114(H) -  Triglycerides <150 mg/dL 53 35 -  Creatinine 8.650.50 - 1.10 mg/dL 7.840.63 6.960.69 0.9   BP/Weight 06/19/2016 04/15/2016 02/02/2016 12/14/2015 02/14/2015 02/16/2014 01/20/2014  Systolic BP 112 120 110 110 114 122 103  Diastolic BP 80 76 68 80 61 80 63  Wt. (Lbs) 184.12 181 177 187.4 185.5 177.04 -  BMI 32.62 32.07 30.37 33.2 32.87 29.93 -  Some encounter information is confidential and restricted. Go to Review Flowsheets activity to see all data.   No flowsheet data found.  Updated lab today

## 2016-06-19 NOTE — Patient Instructions (Signed)
F/u in 4.5 month, call if you need me before  HAPPY BIRTHDAY!!  fasting lipid, HBA1C, Vit D  Today  Flu vaccine today

## 2016-06-19 NOTE — Assessment & Plan Note (Signed)
Controlled, no change in medication  

## 2016-06-19 NOTE — Assessment & Plan Note (Signed)
Updated lab needed at/ before next visit. Hyperlipidemia:Low fat diet discussed and encouraged.   Lipid Panel  Lab Results  Component Value Date   CHOL 199 12/14/2015   HDL 52 12/14/2015   LDLCALC 136 (H) 12/14/2015   TRIG 53 12/14/2015   CHOLHDL 3.8 12/14/2015

## 2016-06-19 NOTE — Assessment & Plan Note (Signed)
Improved uses medication only as needed, less than twice  Per week

## 2016-06-19 NOTE — Assessment & Plan Note (Signed)
After obtaining informed consent, the vaccine is  administered by LPN.  

## 2016-06-19 NOTE — Assessment & Plan Note (Signed)
The increased risk of cardiovascular disease associated with this diagnosis, and the need to consistently work on lifestyle to change this is discussed. Following  a  heart healthy diet ,commitment to 30 minutes of exercise at least 5 days per week, as well as control of blood sugar and cholesterol , and achieving a healthy weight are all the areas to be addressed .  

## 2016-06-19 NOTE — Assessment & Plan Note (Signed)
Controlled, no change in medication Followed by neurology 

## 2016-06-20 LAB — VITAMIN D 25 HYDROXY (VIT D DEFICIENCY, FRACTURES): VIT D 25 HYDROXY: 26 ng/mL — AB (ref 30–100)

## 2016-06-20 LAB — HEMOGLOBIN A1C
Hgb A1c MFr Bld: 5.4 % (ref <5.7)
Mean Plasma Glucose: 108 mg/dL

## 2016-06-20 LAB — LIPID PANEL
CHOLESTEROL: 182 mg/dL (ref 125–200)
HDL: 64 mg/dL (ref >=46)
LDL Cholesterol: 106 mg/dL (ref <130)
TRIGLYCERIDES: 62 mg/dL (ref <150)
Total CHOL/HDL Ratio: 2.8 Ratio (ref <=5.0)
VLDL: 12 mg/dL (ref <30)

## 2016-06-25 DIAGNOSIS — Z01419 Encounter for gynecological examination (general) (routine) without abnormal findings: Secondary | ICD-10-CM | POA: Diagnosis not present

## 2016-06-25 DIAGNOSIS — G43909 Migraine, unspecified, not intractable, without status migrainosus: Secondary | ICD-10-CM | POA: Diagnosis not present

## 2016-06-25 DIAGNOSIS — N920 Excessive and frequent menstruation with regular cycle: Secondary | ICD-10-CM | POA: Diagnosis not present

## 2016-07-02 DIAGNOSIS — G43019 Migraine without aura, intractable, without status migrainosus: Secondary | ICD-10-CM | POA: Diagnosis not present

## 2016-08-20 ENCOUNTER — Ambulatory Visit: Payer: Self-pay | Admitting: Family Medicine

## 2016-10-29 ENCOUNTER — Ambulatory Visit: Payer: Self-pay | Admitting: Family Medicine

## 2017-06-10 ENCOUNTER — Other Ambulatory Visit: Payer: Self-pay | Admitting: Nurse Practitioner

## 2018-02-12 ENCOUNTER — Encounter (HOSPITAL_COMMUNITY): Payer: Self-pay

## 2018-02-12 NOTE — Patient Instructions (Addendum)
Your procedure is scheduled on: Wednesday, May 22  Enter through the Main Entrance of [facility] at: 6 am  Pick up the phone at the desk and dial [phone].  Call this number if you have problems the morning of surgery: [phone].  Remember: Do NOT eat food or Do NOT drink clear liquids (including water) after midnight Tuesday.  Take these medicines the morning of surgery with a SIP OF WATER: None  Stop herbal medications, vitamin supplements and ibuprofen at this time.  Do NOT wear jewelry (body piercing), metal hair clips/bobby pins, make-up, or nail polish. Do NOT wear lotions, powders, or perfumes.  You may wear deoderant. Do NOT shave for 48 hours prior to surgery. Do NOT bring valuables to the hospital.  Have a responsible adult drive you home and stay with you for 24 hours after your procedure.  Home with Christine Fitzgerald cell [phone].

## 2018-02-17 DIAGNOSIS — D251 Intramural leiomyoma of uterus: Secondary | ICD-10-CM | POA: Diagnosis not present

## 2018-02-17 DIAGNOSIS — N939 Abnormal uterine and vaginal bleeding, unspecified: Secondary | ICD-10-CM | POA: Diagnosis not present

## 2018-02-17 DIAGNOSIS — N840 Polyp of corpus uteri: Secondary | ICD-10-CM | POA: Diagnosis not present

## 2018-02-20 ENCOUNTER — Encounter (HOSPITAL_COMMUNITY): Payer: Self-pay

## 2018-02-20 ENCOUNTER — Other Ambulatory Visit: Payer: Self-pay

## 2018-02-20 ENCOUNTER — Encounter (HOSPITAL_COMMUNITY)
Admission: RE | Admit: 2018-02-20 | Discharge: 2018-02-20 | Disposition: A | Discharge status: Home / Self Care | Payer: BLUE CROSS/BLUE SHIELD | Source: Ambulatory Visit | Attending: Obstetrics & Gynecology | Admitting: Obstetrics & Gynecology

## 2018-02-20 DIAGNOSIS — Z01812 Encounter for preprocedural laboratory examination: Secondary | ICD-10-CM | POA: Insufficient documentation

## 2018-02-20 HISTORY — DX: Anxiety disorder, unspecified: F41.9

## 2018-02-20 HISTORY — DX: Headache: R51

## 2018-02-20 HISTORY — DX: Other seasonal allergic rhinitis: J30.2

## 2018-02-20 HISTORY — DX: Headache, unspecified: R51.9

## 2018-02-20 HISTORY — DX: Prediabetes: R73.03

## 2018-02-20 HISTORY — DX: Hyperlipidemia, unspecified: E78.5

## 2018-02-20 LAB — TYPE AND SCREEN
ABO/RH(D): B POS
Antibody Screen: NEGATIVE

## 2018-02-20 LAB — COMPREHENSIVE METABOLIC PANEL
ALBUMIN: 3.8 g/dL (ref 3.5–5.0)
ALK PHOS: 70 U/L (ref 38–126)
ALT: 21 U/L (ref 14–54)
AST: 16 U/L (ref 15–41)
Anion gap: 9 (ref 5–15)
BILIRUBIN TOTAL: 0.2 mg/dL — AB (ref 0.3–1.2)
BUN: 8 mg/dL (ref 6–20)
CALCIUM: 9.4 mg/dL (ref 8.9–10.3)
CO2: 23 mmol/L (ref 22–32)
Chloride: 106 mmol/L (ref 101–111)
Creatinine, Ser: 0.68 mg/dL (ref 0.44–1.00)
GFR calc Af Amer: >60 mL/min (ref >60)
GLUCOSE: 98 mg/dL (ref 65–99)
Potassium: 4.4 mmol/L (ref 3.5–5.1)
Sodium: 138 mmol/L (ref 135–145)
TOTAL PROTEIN: 7 g/dL (ref 6.5–8.1)

## 2018-02-20 LAB — CBC
HEMATOCRIT: 38.7 % (ref 36.0–46.0)
HEMOGLOBIN: 12.7 g/dL (ref 12.0–15.0)
MCH: 28.3 pg (ref 26.0–34.0)
MCHC: 32.8 g/dL (ref 30.0–36.0)
MCV: 86.2 fL (ref 78.0–100.0)
Platelets: 364 10*3/uL (ref 150–400)
RBC: 4.49 MIL/uL (ref 3.87–5.11)
RDW: 12.8 % (ref 11.5–15.5)
WBC: 4.5 10*3/uL (ref 4.0–10.5)

## 2018-02-20 LAB — ABO/RH: ABO/RH(D): B POS

## 2018-02-26 NOTE — H&P (Signed)
42yo G1P1 who presents for hysteroscopy, HTA ablation due to AUB.  In review, she noted significant AUB. Menses have always been heavy, but that overall the past couple of months they have been unbearable. They are always every month, but the amount and heaviness will vary. Menses usually last at least 5-8 days. She has to wear an overnight pad and tampon during the day and she often will have accidents and have to go home and change as she has soaked through her clothes.    Prior work up: 9/18: SHG 9cm anteverted uterus- fibroids stable in size- right 3cm, ant left 2cm. Slightly thickened endometrial lining with 0.5cm hyperechoic mass. Normal ovaries bilaterally 8/28: EMB- benign proliferative endometrium Hgb (6/21): 12.1.      ROS:  CONSTITUTIONAL:  no Chills. no Fever. no Night sweats.  HEENT:  Blurrred vision no. no Double vision.  CARDIOLOGY:  no Chest pain.  RESPIRATORY:  no Shortness of breath. no Cough.  UROLOGY:  no Urinary frequency. no Urinary incontinence. no Urinary urgency.  GASTROENTEROLOGY:  no Abdominal pain. no Appetite change. no Change in bowel movements.  FEMALE REPRODUCTIVE:  no Breast lumps or discharge. no Breast pain.  NEUROLOGY:  no Dizziness. no Headache. no Loss of consciousness.  PSYCHOLOGY:  Anxiety yes. no Depression.  SKIN:  no Rash. no Hives.  HEMATOLOGY/LYMPH:  no Anemia. Fatigue yes. Using Blood Thinners no.         Medical History: Depression, Anxiety, Carpal tunnel.         Gyn History:  Sexual activity currently sexually active.  Periods : regular, every month, heavy blood loss.  LMP 01/26/2018.  Birth control bilateral salpingectomy 07/2014 by DrCousins.  Last pap smear date 01/03/15 - WNL negative HPV.  Last mammogram date N/A.  Abnormal pap smear yes, repeat was normal.  Menarche 12.  GYN procedures 06/10/17 - EMB.        OB History:  Number of pregnancies 1.  Pregnancy # 1 live birth, vaginal delivery, girl.        Surgical History: Hysteroscopy D&C polypectomy davinci with bilateral salpingectomy and lysis of adhesions[facility], carpal tunnel right wrist 02/2015.        Hospitalization/Major Diagnostic Procedure: child birth x 1 .        Family History: Father: alive. Mother: alive. Paternal Grand Father: deceased. Paternal Grand Mother: deceased. Maternal Grand Father: deceased. Maternal Grand Mother: deceased. 3 brother(s) , 3 sister(s) . 1daughter(s) . .  neg family h\/o gyn cancers.       Social History:  General:  Tobacco use  cigarettes: Never smoked Tobacco history last updated 02/17/2018 Alcohol: yes, Rare.  Caffeine: yes, tea.  no Recreational drug use.  Marital Status: married.  Children: 1, girls.  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        Medications: Taking Aleve(Naproxen) 220 MG Tablet 1 tablet as needed Orally every 12 hrs, Not-Taking Fluoxetine HCl 10 MG Capsule 1 capsule in the morning Orally Once a day, Medication List reviewed and reconciled with the patient       Allergies: Salmon.       Objective:     Vitals: Wt 189, Wt change 1 lb, Ht 63.5, BMI 32.95, Pulse sitting 108, BP sitting 113/72.       Examination:  General Examination:  CONSTITUTIONAL: well developed, well nourished.  SKIN: warm and dry, no rashes.  NECK: supple, normal appearance.  LUNGS: clear to auscultation bilaterally,  no wheezes, rhonchi, rales.  HEART: no murmurs, regular rate and rhythm.  ABDOMEN: soft and not tender, no masses palpated, no rebound, no rigidity.  MUSCULOSKELETAL no calf tenderness bilaterally.  EXTREMITIES: no edema present.  PSYCH: appropriate mood and affect.     A/P: 42yo G1P1 who presents for hysteroscopy, D&C, HTA ablation -NPO -LR @ 125cc/hr -SCDs to OR -Risk/benefit and alternatives reviewed with patient including but not limited to risk of bleeding, infection, failure or inability to  complete procedure, uterine perforation.  Pt aware and wishes to proceed.  Myna Hidalgo, DO [phone] (cell) [phone] (office)

## 2018-03-03 NOTE — Anesthesia Preprocedure Evaluation (Addendum)
Anesthesia Evaluation  Patient identified by MRN, date of birth, ID band Patient awake    Reviewed: Allergy & Precautions, NPO status , Patient's Chart, lab work & pertinent test results  Airway Mallampati: II  TM Distance: >3 FB Neck ROM: Full    Dental  (+) Dental Advisory Given   Pulmonary neg pulmonary ROS,    breath sounds clear to auscultation       Cardiovascular negative cardio ROS   Rhythm:Regular Rate:Normal     Neuro/Psych  Headaches, Anxiety Depression    GI/Hepatic negative GI ROS, Neg liver ROS,   Endo/Other  negative endocrine ROS  Renal/GU negative Renal ROS     Musculoskeletal   Abdominal   Peds  Hematology negative hematology ROS (+)   Anesthesia Other Findings   Reproductive/Obstetrics                            Lab Results  Component Value Date   WBC 4.5 02/20/2018   HGB 12.7 02/20/2018   HCT 38.7 02/20/2018   MCV 86.2 02/20/2018   PLT 364 02/20/2018   Lab Results  Component Value Date   CREATININE 0.68 02/20/2018   BUN 8 02/20/2018   NA 138 02/20/2018   K 4.4 02/20/2018   CL 106 02/20/2018   CO2 23 02/20/2018    Anesthesia Physical Anesthesia Plan  ASA: II  Anesthesia Plan: General   Post-op Pain Management:    Induction: Intravenous  PONV Risk Score and Plan: 4 or greater and Ondansetron, Dexamethasone, Scopolamine patch - Pre-op, Midazolam and Treatment may vary due to age or medical condition  Airway Management Planned: LMA  Additional Equipment:   Intra-op Plan:   Post-operative Plan: Extubation in OR  Informed Consent: I have reviewed the patients History and Physical, chart, labs and discussed the procedure including the risks, benefits and alternatives for the proposed anesthesia with the patient or authorized representative who has indicated his/her understanding and acceptance.   Dental advisory given  Plan Discussed with:  CRNA  Anesthesia Plan Comments:        Anesthesia Quick Evaluation

## 2018-03-04 ENCOUNTER — Encounter (HOSPITAL_COMMUNITY)
Admission: RE | Disposition: A | Discharge status: Home / Self Care | Payer: Self-pay | Source: Ambulatory Visit | Attending: Obstetrics & Gynecology

## 2018-03-04 ENCOUNTER — Ambulatory Visit (HOSPITAL_COMMUNITY): Payer: BLUE CROSS/BLUE SHIELD | Admitting: Anesthesiology

## 2018-03-04 ENCOUNTER — Ambulatory Visit (HOSPITAL_COMMUNITY)
Admission: RE | Admit: 2018-03-04 | Discharge: 2018-03-04 | Disposition: A | Discharge status: Home / Self Care | Payer: BLUE CROSS/BLUE SHIELD | Source: Ambulatory Visit | Attending: Obstetrics & Gynecology | Admitting: Obstetrics & Gynecology

## 2018-03-04 ENCOUNTER — Other Ambulatory Visit: Payer: Self-pay

## 2018-03-04 ENCOUNTER — Encounter (HOSPITAL_COMMUNITY): Payer: Self-pay

## 2018-03-04 DIAGNOSIS — N939 Abnormal uterine and vaginal bleeding, unspecified: Secondary | ICD-10-CM | POA: Insufficient documentation

## 2018-03-04 DIAGNOSIS — N840 Polyp of corpus uteri: Secondary | ICD-10-CM | POA: Diagnosis not present

## 2018-03-04 DIAGNOSIS — R51 Headache: Secondary | ICD-10-CM | POA: Insufficient documentation

## 2018-03-04 DIAGNOSIS — N920 Excessive and frequent menstruation with regular cycle: Secondary | ICD-10-CM | POA: Insufficient documentation

## 2018-03-04 DIAGNOSIS — F418 Other specified anxiety disorders: Secondary | ICD-10-CM | POA: Insufficient documentation

## 2018-03-04 HISTORY — PX: DILITATION & CURRETTAGE/HYSTROSCOPY WITH HYDROTHERMAL ABLATION: SHX5570

## 2018-03-04 LAB — PREGNANCY, URINE: PREG TEST UR: NEGATIVE

## 2018-03-04 SURGERY — DILATATION & CURETTAGE/HYSTEROSCOPY WITH HYDROTHERMAL ABLATION
Anesthesia: General

## 2018-03-04 MED ORDER — LIDOCAINE HCL (CARDIAC) PF 100 MG/5ML IV SOSY
PREFILLED_SYRINGE | INTRAVENOUS | Status: AC
  Filled 2018-03-04: qty 5

## 2018-03-04 MED ORDER — LACTATED RINGERS IV SOLN
INTRAVENOUS | Status: DC
  Administered 2018-03-04: 125 mL/h via INTRAVENOUS
  Administered 2018-03-04: 09:00:00 via INTRAVENOUS

## 2018-03-04 MED ORDER — DEXAMETHASONE SODIUM PHOSPHATE 10 MG/ML IJ SOLN
INTRAMUSCULAR | Status: AC
  Filled 2018-03-04: qty 1

## 2018-03-04 MED ORDER — HYDROMORPHONE HCL 1 MG/ML IJ SOLN
INTRAMUSCULAR | Status: AC
  Filled 2018-03-04: qty 0.5

## 2018-03-04 MED ORDER — PROPOFOL 10 MG/ML IV BOLUS
INTRAVENOUS | Status: DC | PRN
  Administered 2018-03-04: 180 mg via INTRAVENOUS

## 2018-03-04 MED ORDER — LIDOCAINE HCL (CARDIAC) PF 100 MG/5ML IV SOSY
PREFILLED_SYRINGE | INTRAVENOUS | Status: DC | PRN
  Administered 2018-03-04: 60 mg via INTRAVENOUS

## 2018-03-04 MED ORDER — DEXAMETHASONE SODIUM PHOSPHATE 10 MG/ML IJ SOLN
INTRAMUSCULAR | Status: DC | PRN
  Administered 2018-03-04: 10 mg via INTRAVENOUS

## 2018-03-04 MED ORDER — HYDROMORPHONE HCL 1 MG/ML IJ SOLN
0.2500 mg | INTRAMUSCULAR | Status: DC | PRN
  Administered 2018-03-04 (×3): 0.5 mg via INTRAVENOUS

## 2018-03-04 MED ORDER — KETOROLAC TROMETHAMINE 30 MG/ML IJ SOLN
INTRAMUSCULAR | Status: AC
  Filled 2018-03-04: qty 1

## 2018-03-04 MED ORDER — SCOPOLAMINE 1 MG/3DAYS TD PT72
MEDICATED_PATCH | TRANSDERMAL | Status: AC
  Administered 2018-03-04: 1.5 mg via TRANSDERMAL
  Filled 2018-03-04: qty 1

## 2018-03-04 MED ORDER — PROMETHAZINE HCL 25 MG/ML IJ SOLN: 6.2500 mg | INTRAMUSCULAR | Status: DC | PRN

## 2018-03-04 MED ORDER — ONDANSETRON HCL 4 MG/2ML IJ SOLN
INTRAMUSCULAR | Status: AC
  Filled 2018-03-04: qty 2

## 2018-03-04 MED ORDER — LACTATED RINGERS IV SOLN: INTRAVENOUS | Status: DC

## 2018-03-04 MED ORDER — KETOROLAC TROMETHAMINE 30 MG/ML IJ SOLN
INTRAMUSCULAR | Status: DC | PRN
  Administered 2018-03-04: 30 mg via INTRAVENOUS

## 2018-03-04 MED ORDER — SCOPOLAMINE 1 MG/3DAYS TD PT72
1.0000 | MEDICATED_PATCH | Freq: Once | TRANSDERMAL | Status: DC
  Administered 2018-03-04: 1.5 mg via TRANSDERMAL

## 2018-03-04 MED ORDER — FENTANYL CITRATE (PF) 100 MCG/2ML IJ SOLN
INTRAMUSCULAR | Status: DC | PRN
  Administered 2018-03-04: 50 ug via INTRAVENOUS
  Administered 2018-03-04: 100 ug via INTRAVENOUS
  Administered 2018-03-04: 50 ug via INTRAVENOUS

## 2018-03-04 MED ORDER — MIDAZOLAM HCL 2 MG/2ML IJ SOLN
INTRAMUSCULAR | Status: AC
  Filled 2018-03-04: qty 2

## 2018-03-04 MED ORDER — SODIUM CHLORIDE 0.9 % IR SOLN
Status: DC | PRN
  Administered 2018-03-04: 3000 mL

## 2018-03-04 MED ORDER — MIDAZOLAM HCL 2 MG/2ML IJ SOLN
INTRAMUSCULAR | Status: DC | PRN
  Administered 2018-03-04: 2 mg via INTRAVENOUS

## 2018-03-04 MED ORDER — 0.9 % SODIUM CHLORIDE (POUR BTL) OPTIME
TOPICAL | Status: DC | PRN
  Administered 2018-03-04: 1000 mL

## 2018-03-04 MED ORDER — HYDROMORPHONE HCL 1 MG/ML IJ SOLN
INTRAMUSCULAR | Status: AC
  Filled 2018-03-04: qty 1

## 2018-03-04 MED ORDER — PROPOFOL 10 MG/ML IV BOLUS
INTRAVENOUS | Status: AC
  Filled 2018-03-04: qty 20

## 2018-03-04 MED ORDER — ONDANSETRON HCL 4 MG/2ML IJ SOLN
INTRAMUSCULAR | Status: DC | PRN
  Administered 2018-03-04: 4 mg via INTRAVENOUS

## 2018-03-04 MED ORDER — FENTANYL CITRATE (PF) 250 MCG/5ML IJ SOLN
INTRAMUSCULAR | Status: AC
  Filled 2018-03-04: qty 5

## 2018-03-04 SURGICAL SUPPLY — 11 items
CANISTER SUCT 3000ML PPV (MISCELLANEOUS) ×2 IMPLANT
CATH ROBINSON RED A/P 16FR (CATHETERS) ×2 IMPLANT
DILATOR CANAL MILEX (MISCELLANEOUS) IMPLANT
GLOVE BIOGEL PI IND STRL 7.0 (GLOVE) ×2 IMPLANT
GLOVE BIOGEL PI INDICATOR 7.0 (GLOVE) ×2
GLOVE ECLIPSE 6.5 STRL STRAW (GLOVE) ×2 IMPLANT
GOWN STRL REUS W/TWL LRG LVL3 (GOWN DISPOSABLE) ×4 IMPLANT
PACK VAGINAL MINOR WOMEN LF (CUSTOM PROCEDURE TRAY) ×2 IMPLANT
PAD OB MATERNITY 4.3X12.25 (PERSONAL CARE ITEMS) ×2 IMPLANT
SET GENESYS HTA PROCERVA (MISCELLANEOUS) ×2 IMPLANT
TOWEL OR 17X24 6PK STRL BLUE (TOWEL DISPOSABLE) ×4 IMPLANT

## 2018-03-04 NOTE — Anesthesia Procedure Notes (Signed)
Procedure Name: LMA Insertion Date/Time: 03/04/2018 7:18 AM Performed by: Algis Greenhouse, CRNA Pre-anesthesia Checklist: Patient being monitored, Patient identified, Emergency Drugs available and Suction available Patient Re-evaluated:Patient Re-evaluated prior to induction Oxygen Delivery Method: Circle system utilized Preoxygenation: Pre-oxygenation with 100% oxygen Induction Type: IV induction and Inhalational induction Ventilation: Mask ventilation without difficulty LMA: LMA inserted LMA Size: 4.0 Number of attempts: 1 Dental Injury: Teeth and Oropharynx as per pre-operative assessment

## 2018-03-04 NOTE — Anesthesia Postprocedure Evaluation (Signed)
Anesthesia Post Note  Patient: Christine Fitzgerald  Procedure(s) Performed: DILATATION & CURETTAGE/HYSTEROSCOPY WITH HYDROTHERMAL ABLATION (N/A )     Patient location during evaluation: PACU Anesthesia Type: General Level of consciousness: awake and alert Pain management: pain level controlled Vital Signs Assessment: post-procedure vital signs reviewed and stable Respiratory status: spontaneous breathing, nonlabored ventilation, respiratory function stable and patient connected to nasal cannula oxygen Cardiovascular status: blood pressure returned to baseline and stable Postop Assessment: no apparent nausea or vomiting Anesthetic complications: no    Last Vitals:  Vitals:   03/04/18 0915 03/04/18 0955  BP: 120/87 (!) 143/87  Pulse: 83 78  Resp: 20 16  Temp:  36.5 C  SpO2: 99% 100%    Last Pain:  Vitals:   03/04/18 0955  TempSrc:   PainSc: 2    Pain Goal: Patients Stated Pain Goal: 4 (03/04/18 0955)               Kennieth Rad

## 2018-03-04 NOTE — Transfer of Care (Signed)
Immediate Anesthesia Transfer of Care Note  Patient: Christine Fitzgerald  Procedure(s) Performed: DILATATION & CURETTAGE/HYSTEROSCOPY WITH HYDROTHERMAL ABLATION (N/A )  Patient Location: PACU  Anesthesia Type:General  Level of Consciousness: sedated  Airway & Oxygen Therapy: Patient Spontanous Breathing and Patient connected to nasal cannula oxygen  Post-op Assessment: Report given to RN  Post vital signs: Reviewed and stable  Last Vitals:  Vitals Value Taken Time  BP    Temp    Pulse 101 03/04/2018  8:03 AM  Resp    SpO2 98 % 03/04/2018  8:03 AM  Vitals shown include unvalidated device data.  Last Pain:  Vitals:   03/04/18 0618  TempSrc: Oral  PainSc: 0-No pain      Patients Stated Pain Goal: 4 (03/04/18 4540)  Complications: No apparent anesthesia complications

## 2018-03-04 NOTE — Progress Notes (Deleted)
Operative Report  PreOp: Abnormal uterine bleeding PostOp: same Procedure:  Hysteroscopy, Dilation and Curettage, polypectomy, Endometrial ablation Surgeon: Dr. Myna Hidalgo Anesthesia: General Complications:none EBL: Minimal  Findings: 9cm anteverted uterus with proliferative endometrium and small polyp noted anterior wall, both ostia visualized  Specimens: 1) endometrial curetings with uterine polyp   Procedure: The patient was taken to the operating room where she underwent general anesthesia without difficulty. The patient was placed in a low lithotomy position using Allen stirrups. The patient was examined with the findings as noted above.  She was then prepped and draped in the normal sterile fashion. The bladder was drained using a red rubber urethral catheter. A sterile speculum was inserted into the vagina. A single tooth tenaculum was placed on the anterior lip of the cervix. The uterus was then sounded to 9cm. The endocervical canal was then serially dilated to 14French using Hank dilators.  The diagnostic hysteroscope was then inserted without difficulty and noted to have the findings as listed above. Using the hysteroscope, the cervical length was noted to be 9cm. Visualization was achieved using NS as a distending medium. The hysteroscope was removed and sharp curettage was performed. The tissue was sent to pathology.   Attention was then turned to the HTA. The HTA was set up according to manufacture instructions.  The device was activated and visualization was achieved.  Upon completion, the HTA system was removed.  All instrument were then removed. Hemostasis was observed at the cervical site with silver nitrazine.  The patient was repositioned to the supine position. The patient tolerated the procedure without any complications and taken to recovery in stable condition.   Myna Hidalgo, DO [phone] (pager) [phone] (office)

## 2018-03-04 NOTE — Discharge Instructions (Addendum)
Hysteroscopy, Care After Refer to this sheet in the next few weeks. These instructions provide you with information on caring for yourself after your procedure. Your health care provider may also give you more specific instructions. Your treatment has been planned according to current medical practices, but problems sometimes occur. Call your health care provider if you have any problems or questions after your procedure. What can I expect after the procedure? After your procedure, it is typical to have the following:  You may have some cramping. This normally lasts for a couple days.  You may have bleeding. This can vary from light spotting for a few days to menstrual-like bleeding for 3-7 days.  Follow these instructions at home:  Rest for the first 1-2 days after the procedure.  Only take over-the-counter or prescription medicines as directed by your health care provider. Do not take aspirin. It can increase the chances of bleeding.  Take showers instead of baths for 2 weeks or as directed by your health care provider.  Do not drive for 24 hours or as directed.  Do not drink alcohol while taking pain medicine.  Do not use tampons, douche, or have sexual intercourse for 2 weeks or until your health care provider says it is okay.  Take your temperature twice a day for 4-5 days. Write it down each time.  Follow your health care provider's advice about diet, exercise, and lifting.  If you develop constipation, you may: ? Take a mild laxative if your health care provider approves. ? Add bran foods to your diet. ? Drink enough fluids to keep your urine clear or pale yellow.  Try to have someone with you or available to you for the first 24-48 hours, especially if you were given a general anesthetic.  Follow up with your health care provider as directed. Contact a health care provider if:  You feel dizzy or lightheaded.  You feel sick to your stomach (nauseous).  You have  abnormal vaginal discharge.  You have a rash.  You have pain that is not controlled with medicine. Get help right away if:  You have bleeding that is heavier than a normal menstrual period.  You have a fever.  You have increasing cramps or pain, not controlled with medicine.  You have new belly (abdominal) pain.  You pass out.  You have pain in the tops of your shoulders (shoulder strap areas).  You have shortness of breath.  Special Instructions/Symptoms: Your throat may feel dry or sore from the anesthesia or the breathing tube placed in your throat during surgery. If this causes discomfort, gargle with warm salt water. The discomfort should disappear within 24 hours.  If you had a scopolamine patch placed behind your ear for the management of post- operative nausea and/or vomiting:  1. The medication in the patch is effective for 72 hours, after which it should be removed.  Wrap patch in a tissue and discard in the trash. Wash hands thoroughly with soap and water. 2. You may remove the patch earlier than 72 hours if you experience unpleasant side effects which may include dry mouth, dizziness or visual disturbances. 3. Avoid touching the patch. Wash your hands with soap and water after contact with the patch.

## 2018-03-04 NOTE — Interval H&P Note (Signed)
History and Physical Interval Note:  03/04/2018 6:49 AM  Christine Fitzgerald  has presented today for surgery, with the diagnosis of menorrhagia N92.0 abnormal uterine bleeding N93.9  The various methods of treatment have been discussed with the patient and family. After consideration of risks, benefits and other options for treatment, the patient has consented to  Procedure(s): DILATATION & CURETTAGE/HYSTEROSCOPY WITH HYDROTHERMAL ABLATION (N/A) as a surgical intervention .  The patient's history has been reviewed, patient examined, no change in status, stable for surgery.  I have reviewed the patient's chart and labs.  Questions were answered to the patient's satisfaction.     Sharon Seller

## 2018-03-05 ENCOUNTER — Encounter (HOSPITAL_COMMUNITY): Payer: Self-pay | Admitting: Obstetrics & Gynecology

## 2018-03-05 NOTE — Op Note (Signed)
Operative Report  PreOp: Abnormal uterine bleeding PostOp: same Procedure:  Hysteroscopy, Dilation and Curettage, polypectomy, Endometrial ablation Surgeon: Dr. Aryon Nham Anesthesia: General Complications:none EBL: Minimal  Findings: 9cm anteverted uterus with proliferative endometrium and small polyp noted anterior wall, both ostia visualized  Specimens: 1) endometrial curetings with uterine polyp   Procedure: The patient was taken to the operating room where she underwent general anesthesia without difficulty. The patient was placed in a low lithotomy position using Allen stirrups. The patient was examined with the findings as noted above.  She was then prepped and draped in the normal sterile fashion. The bladder was drained using a red rubber urethral catheter. A sterile speculum was inserted into the vagina. A single tooth tenaculum was placed on the anterior lip of the cervix. The uterus was then sounded to 9cm. The endocervical canal was then serially dilated to 14French using Hank dilators.  The diagnostic hysteroscope was then inserted without difficulty and noted to have the findings as listed above. Using the hysteroscope, the cervical length was noted to be 9cm. Visualization was achieved using NS as a distending medium. The hysteroscope was removed and sharp curettage was performed. The tissue was sent to pathology.   Attention was then turned to the HTA. The HTA was set up according to manufacture instructions.  The device was activated and visualization was achieved.  Upon completion, the HTA system was removed.  All instrument were then removed. Hemostasis was observed at the cervical site with silver nitrazine.  The patient was repositioned to the supine position. The patient tolerated the procedure without any complications and taken to recovery in stable condition.   Keston Seever, DO [phone] (pager) [phone] (office)     

## 2018-03-20 DIAGNOSIS — N939 Abnormal uterine and vaginal bleeding, unspecified: Secondary | ICD-10-CM | POA: Diagnosis not present

## 2018-03-20 DIAGNOSIS — Z9889 Other specified postprocedural states: Secondary | ICD-10-CM | POA: Diagnosis not present

## 2018-06-05 DIAGNOSIS — N939 Abnormal uterine and vaginal bleeding, unspecified: Secondary | ICD-10-CM | POA: Diagnosis not present

## 2018-06-05 DIAGNOSIS — Z9889 Other specified postprocedural states: Secondary | ICD-10-CM | POA: Diagnosis not present

## 2019-01-07 DIAGNOSIS — Z01419 Encounter for gynecological examination (general) (routine) without abnormal findings: Secondary | ICD-10-CM | POA: Diagnosis not present

## 2019-04-02 ENCOUNTER — Other Ambulatory Visit: Payer: Self-pay | Admitting: Obstetrics & Gynecology

## 2019-04-02 DIAGNOSIS — Z1231 Encounter for screening mammogram for malignant neoplasm of breast: Secondary | ICD-10-CM

## 2019-05-14 ENCOUNTER — Ambulatory Visit: Payer: BLUE CROSS/BLUE SHIELD

## 2019-06-10 ENCOUNTER — Ambulatory Visit
Admission: RE | Admit: 2019-06-10 | Discharge: 2019-06-10 | Disposition: A | Discharge status: Home / Self Care | Payer: BC Managed Care – PPO | Source: Ambulatory Visit | Attending: Obstetrics & Gynecology | Admitting: Obstetrics & Gynecology

## 2019-06-10 ENCOUNTER — Other Ambulatory Visit: Payer: Self-pay

## 2019-06-10 DIAGNOSIS — Z1231 Encounter for screening mammogram for malignant neoplasm of breast: Secondary | ICD-10-CM

## 2020-01-12 DIAGNOSIS — Z01419 Encounter for gynecological examination (general) (routine) without abnormal findings: Secondary | ICD-10-CM | POA: Diagnosis not present

## 2020-05-03 ENCOUNTER — Other Ambulatory Visit: Payer: Self-pay | Admitting: Obstetrics & Gynecology

## 2020-05-03 DIAGNOSIS — Z1231 Encounter for screening mammogram for malignant neoplasm of breast: Secondary | ICD-10-CM

## 2020-06-16 ENCOUNTER — Other Ambulatory Visit: Payer: Self-pay

## 2020-06-16 ENCOUNTER — Ambulatory Visit
Admission: RE | Admit: 2020-06-16 | Discharge: 2020-06-16 | Disposition: A | Discharge status: Home / Self Care | Payer: BC Managed Care – PPO | Source: Ambulatory Visit | Attending: Obstetrics & Gynecology | Admitting: Obstetrics & Gynecology

## 2020-06-16 DIAGNOSIS — Z1231 Encounter for screening mammogram for malignant neoplasm of breast: Secondary | ICD-10-CM

## 2020-08-03 DIAGNOSIS — Z23 Encounter for immunization: Secondary | ICD-10-CM | POA: Diagnosis not present

## 2020-08-18 DIAGNOSIS — Z20822 Contact with and (suspected) exposure to covid-19: Secondary | ICD-10-CM | POA: Diagnosis not present

## 2021-02-13 DIAGNOSIS — N939 Abnormal uterine and vaginal bleeding, unspecified: Secondary | ICD-10-CM | POA: Diagnosis not present

## 2021-02-13 DIAGNOSIS — Z01419 Encounter for gynecological examination (general) (routine) without abnormal findings: Secondary | ICD-10-CM | POA: Diagnosis not present

## 2021-06-21 ENCOUNTER — Other Ambulatory Visit: Payer: Self-pay | Admitting: Obstetrics and Gynecology

## 2021-06-21 DIAGNOSIS — Z1231 Encounter for screening mammogram for malignant neoplasm of breast: Secondary | ICD-10-CM

## 2021-07-26 ENCOUNTER — Other Ambulatory Visit: Payer: Self-pay

## 2021-07-26 ENCOUNTER — Ambulatory Visit
Admission: RE | Admit: 2021-07-26 | Discharge: 2021-07-26 | Disposition: A | Discharge status: Home / Self Care | Payer: BC Managed Care – PPO | Source: Ambulatory Visit | Attending: Obstetrics and Gynecology | Admitting: Obstetrics and Gynecology

## 2021-07-26 DIAGNOSIS — Z1231 Encounter for screening mammogram for malignant neoplasm of breast: Secondary | ICD-10-CM

## 2021-10-17 IMAGING — MG MM DIGITAL SCREENING BILAT W/ TOMO AND CAD
8 series · 8 of 24 positions shown · non-contrast
Comparison: Previous exam(s).

CLINICAL DATA: Screening.

EXAM:
DIGITAL SCREENING BILATERAL MAMMOGRAM WITH TOMOSYNTHESIS AND CAD
TECHNIQUE: Bilateral screening digital craniocaudal and mediolateral oblique
mammograms were obtained. Bilateral screening digital breast
tomosynthesis was performed. The images were evaluated with
computer-aided detection.

[L MLO synth-2D]
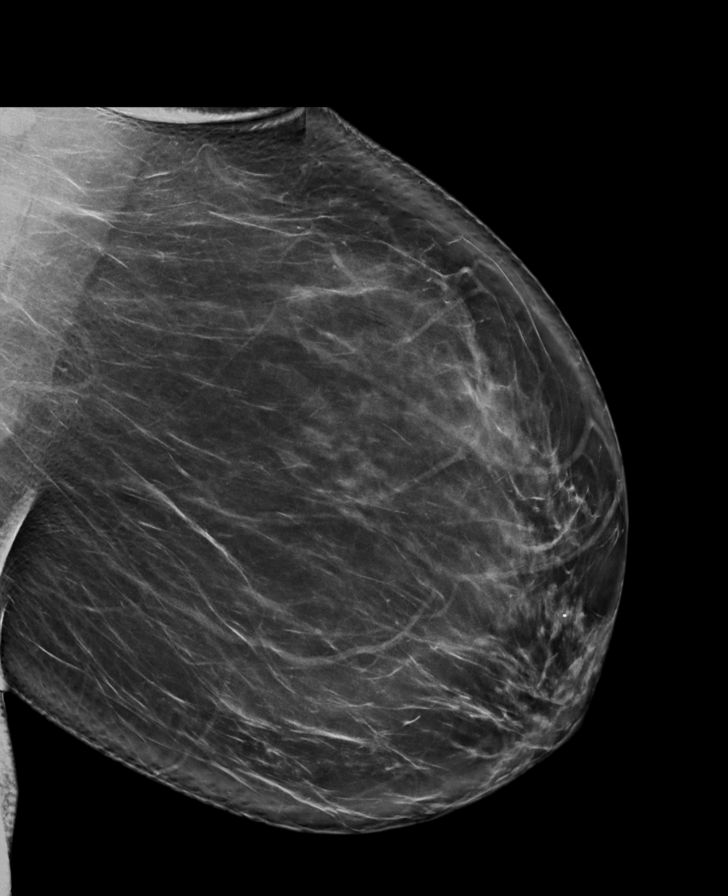

[L CC synth-2D]
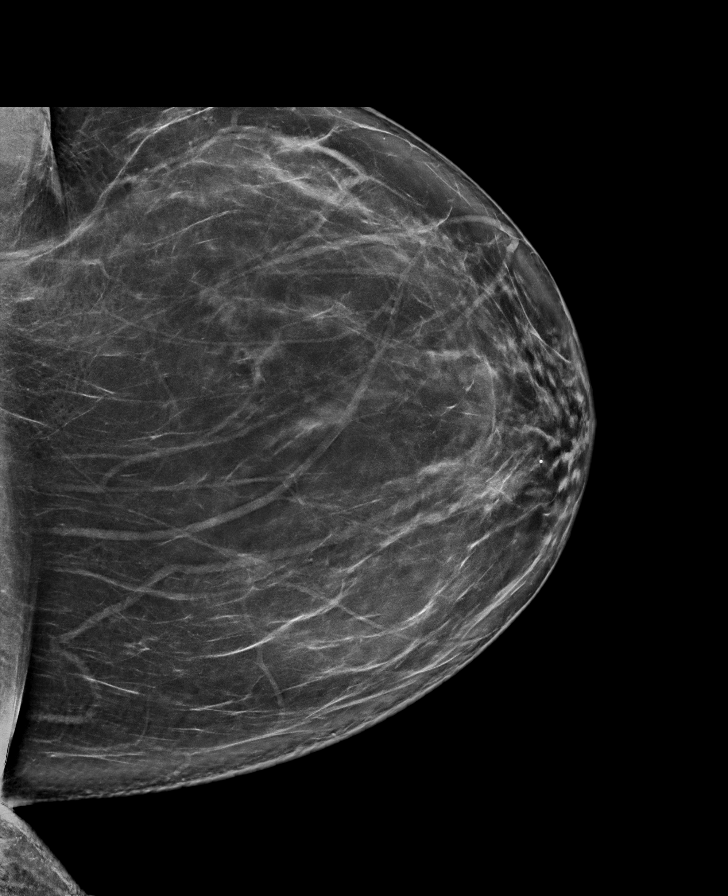

[R MLO synth-2D]
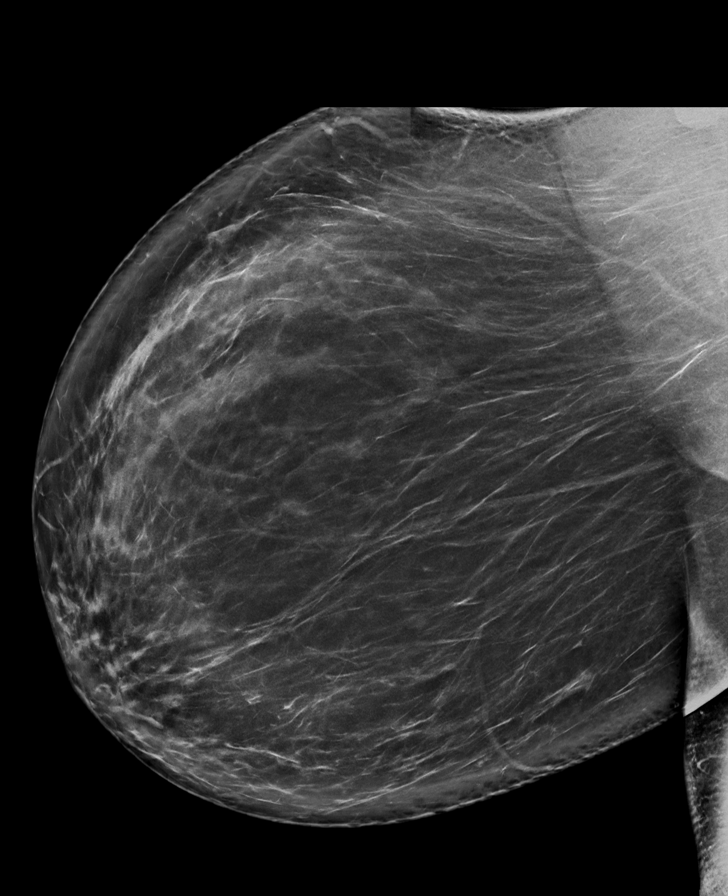

[R CC synth-2D]
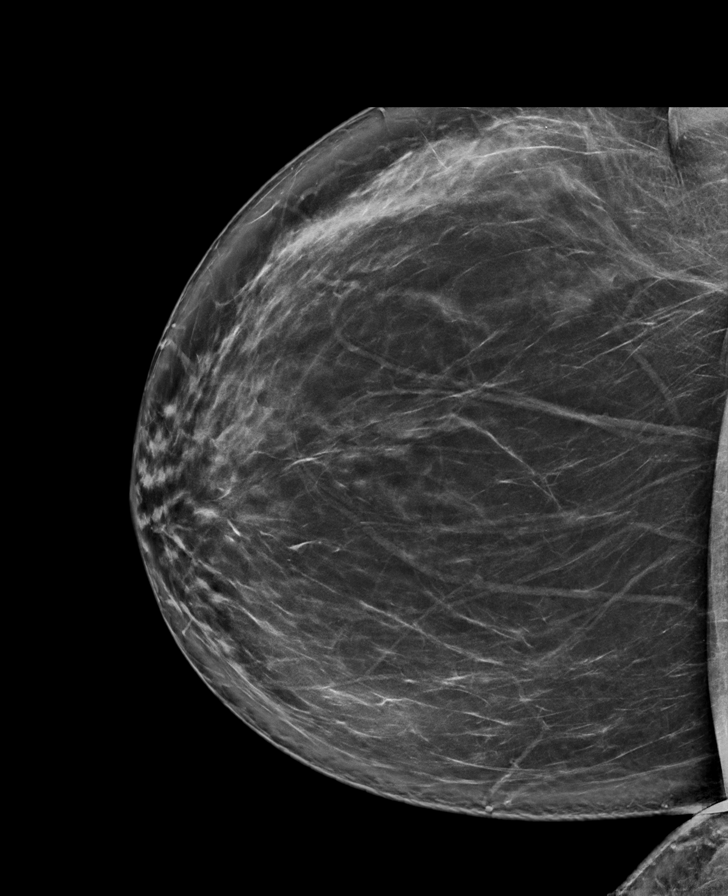

[L MLO tomo · tomo slice 51/100.0]
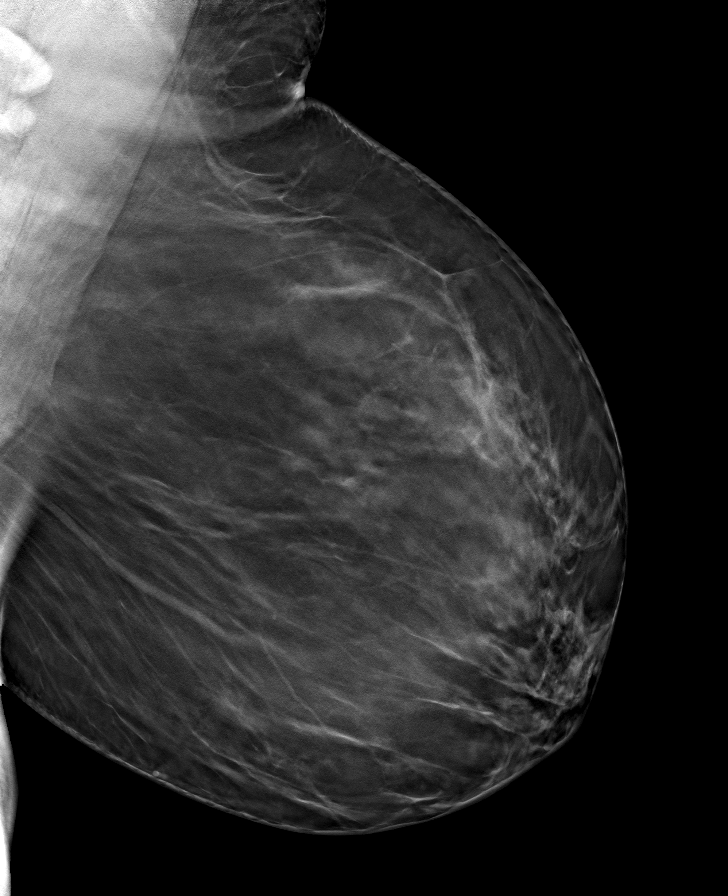

[R MLO tomo · tomo slice 52/103.0]
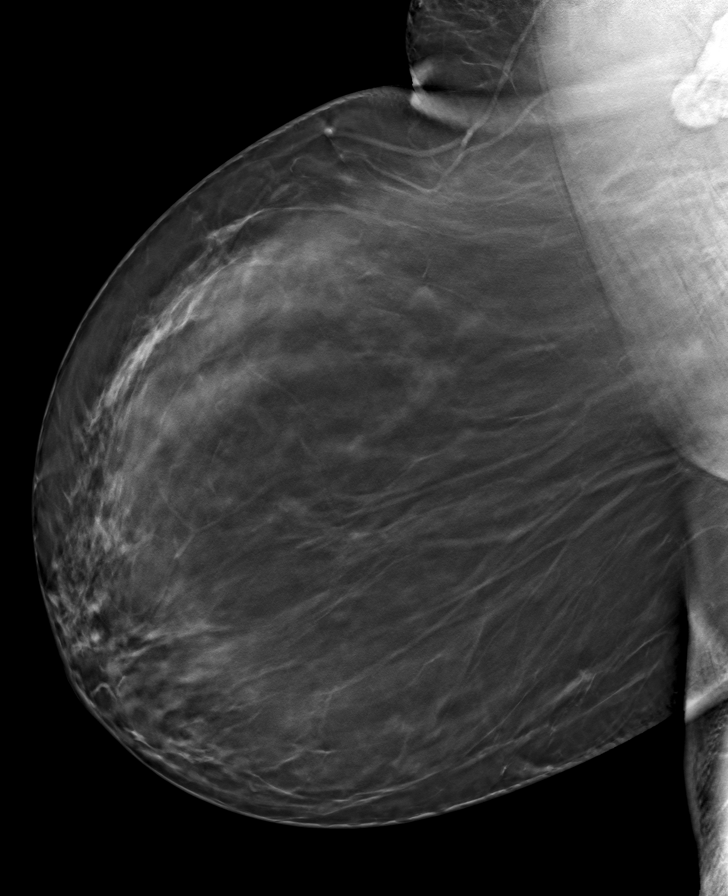

[L CC tomo · tomo slice 48/95.0]
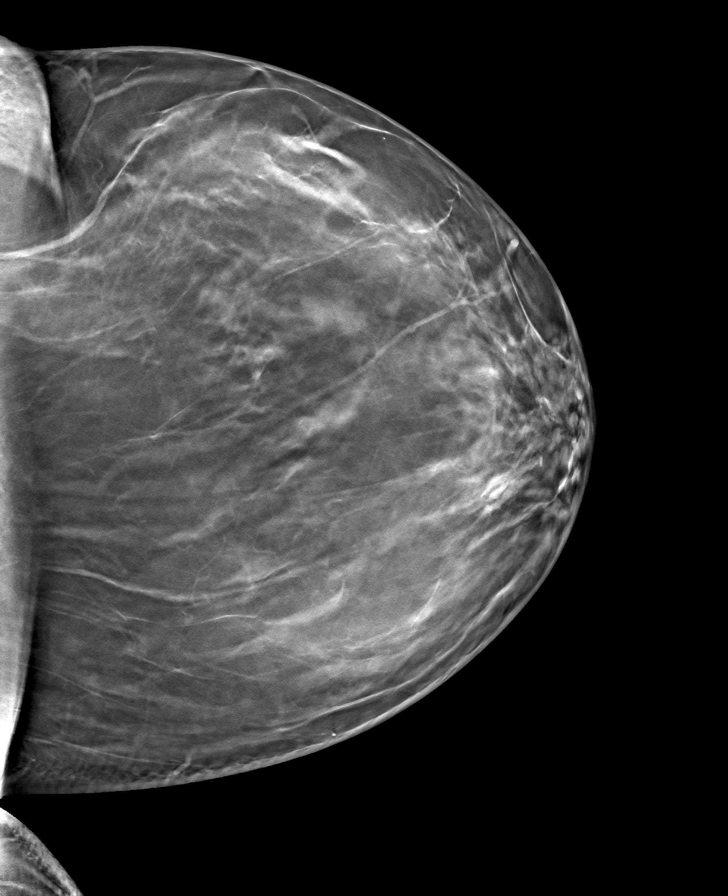

[R CC tomo · tomo slice 49/96.0]
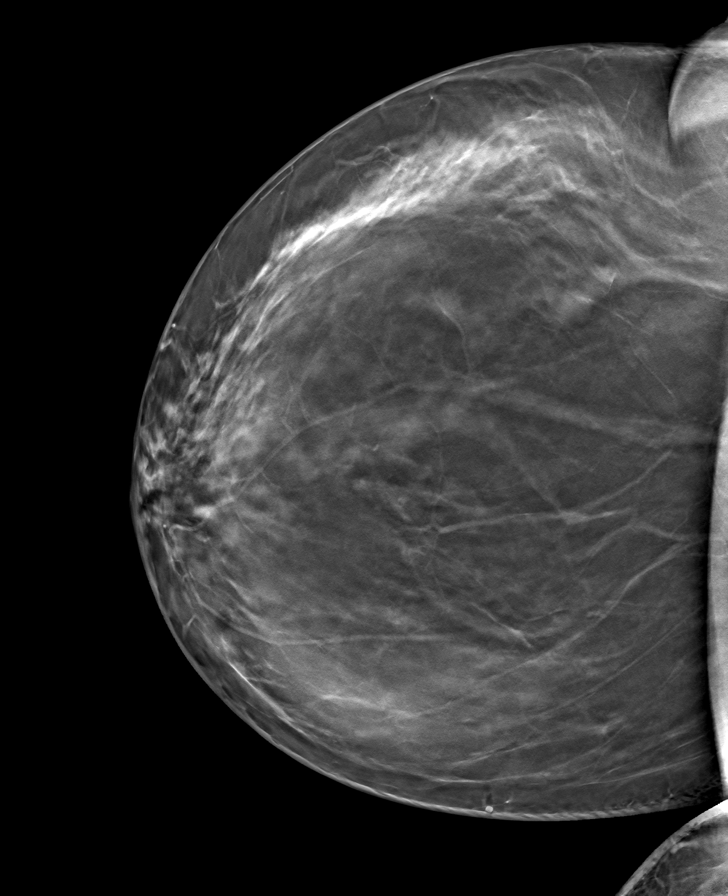

[8 of 24 positions shown; findings below may reference images not displayed]

ACR Breast Density Category b: There are scattered areas of
fibroglandular density.
FINDINGS: There are no findings suspicious for malignancy.
IMPRESSION: No mammographic evidence of malignancy. A result letter of this
screening mammogram will be mailed directly to the patient.

RECOMMENDATION:
Screening mammogram in one year. (Code:51-O-LD2)

BI-RADS CATEGORY  1: Negative.

## 2022-02-18 DIAGNOSIS — Z01419 Encounter for gynecological examination (general) (routine) without abnormal findings: Secondary | ICD-10-CM | POA: Diagnosis not present

## 2022-10-02 ENCOUNTER — Other Ambulatory Visit: Payer: Self-pay | Admitting: Obstetrics and Gynecology

## 2022-10-02 DIAGNOSIS — Z1231 Encounter for screening mammogram for malignant neoplasm of breast: Secondary | ICD-10-CM

## 2022-10-11 ENCOUNTER — Ambulatory Visit
Admission: RE | Admit: 2022-10-11 | Discharge: 2022-10-11 | Disposition: A | Discharge status: Home / Self Care | Payer: BC Managed Care – PPO | Source: Ambulatory Visit | Attending: Obstetrics and Gynecology | Admitting: Obstetrics and Gynecology

## 2022-10-11 DIAGNOSIS — Z1231 Encounter for screening mammogram for malignant neoplasm of breast: Secondary | ICD-10-CM | POA: Diagnosis not present

## 2023-01-19 ENCOUNTER — Ambulatory Visit
Admission: EM | Admit: 2023-01-19 | Discharge: 2023-01-19 | Disposition: A | Discharge status: Home / Self Care | Payer: BC Managed Care – PPO | Attending: Internal Medicine | Admitting: Internal Medicine

## 2023-01-19 ENCOUNTER — Ambulatory Visit (INDEPENDENT_AMBULATORY_CARE_PROVIDER_SITE_OTHER): Payer: BC Managed Care – PPO

## 2023-01-19 DIAGNOSIS — M25521 Pain in right elbow: Secondary | ICD-10-CM

## 2023-01-19 DIAGNOSIS — M25572 Pain in left ankle and joints of left foot: Secondary | ICD-10-CM

## 2023-01-19 DIAGNOSIS — M545 Low back pain, unspecified: Secondary | ICD-10-CM

## 2023-01-19 DIAGNOSIS — S59901A Unspecified injury of right elbow, initial encounter: Secondary | ICD-10-CM | POA: Diagnosis not present

## 2023-01-19 DIAGNOSIS — M25571 Pain in right ankle and joints of right foot: Secondary | ICD-10-CM

## 2023-01-19 MED ORDER — METHOCARBAMOL 500 MG PO TABS: 500.0000 mg | ORAL_TABLET | Freq: Two times a day (BID) | ORAL | 0 refills | Status: DC | PRN

## 2023-01-19 NOTE — Discharge Instructions (Addendum)
suspect muscle strain/inflammation.  I have prescribed you a muscle relaxer to take as needed.  Do not drive or drink alcohol with taking it as it can make you drowsy.  Follow-up if symptoms persist or worsen.
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
## 2023-01-28 ENCOUNTER — Ambulatory Visit: Payer: BC Managed Care – PPO | Admitting: Family Medicine

## 2023-01-28 ENCOUNTER — Encounter: Payer: Self-pay | Admitting: Family Medicine

## 2023-01-28 VITALS — BP 138/88 | HR 99 | Ht 63.0 in | Wt 196.0 lb

## 2023-01-28 DIAGNOSIS — M79601 Pain in right arm: Secondary | ICD-10-CM | POA: Insufficient documentation

## 2023-01-28 DIAGNOSIS — Z0001 Encounter for general adult medical examination with abnormal findings: Secondary | ICD-10-CM

## 2023-01-28 DIAGNOSIS — Z131 Encounter for screening for diabetes mellitus: Secondary | ICD-10-CM

## 2023-01-28 DIAGNOSIS — Z1329 Encounter for screening for other suspected endocrine disorder: Secondary | ICD-10-CM | POA: Diagnosis not present

## 2023-01-28 DIAGNOSIS — Z1159 Encounter for screening for other viral diseases: Secondary | ICD-10-CM

## 2023-01-28 DIAGNOSIS — Z13220 Encounter for screening for lipoid disorders: Secondary | ICD-10-CM | POA: Diagnosis not present

## 2023-01-28 DIAGNOSIS — Z1211 Encounter for screening for malignant neoplasm of colon: Secondary | ICD-10-CM

## 2023-01-28 DIAGNOSIS — I10 Essential (primary) hypertension: Secondary | ICD-10-CM | POA: Diagnosis not present

## 2023-01-28 DIAGNOSIS — Z1321 Encounter for screening for nutritional disorder: Secondary | ICD-10-CM

## 2023-01-28 MED ORDER — GABAPENTIN 300 MG PO CAPS
300.0000 mg | ORAL_CAPSULE | Freq: Three times a day (TID) | ORAL | 0 refills | Status: DC | PRN
Start: 2023-01-28 — End: 2023-06-19

## 2023-01-28 MED ORDER — PREDNISONE 20 MG PO TABS
20.0000 mg | ORAL_TABLET | Freq: Two times a day (BID) | ORAL | 0 refills | Status: AC
Start: 2023-01-28 — End: 2023-02-02

## 2023-01-28 NOTE — Assessment & Plan Note (Addendum)
Recent Xray done on 01/19/23 findings showed- no evidence of fracture, dislocation, or joint effusion. There is no evidence of arthropathy or other focal bone abnormality. Soft tissues are unremarkable.  Prescribed 5 day course of Prednisone 20 mg and Gabapentin 300 mg PRN If pain still persist ultrasound will be ordered to assess the structural integrity of the nerve.  Explained to patient Non pharmacological interventions include the use of ice or heat, rest, recommend range of motion exercises, gentle stretching. Follow up for worsening or persistent symptoms. Patient verbalizes understanding regarding plan of care and all questions answered.

## 2023-01-28 NOTE — Patient Instructions (Signed)
It was pleasure meeting with you today. Please take medications as prescribed. Follow up with your primary health provider if any health concerns arises. If symptoms worsen please contact your primary care provider and/or visit the emergency department.  
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
## 2023-01-29 LAB — CMP14+EGFR
ALT: 20 IU/L (ref 0–32)
AST: 14 IU/L (ref 0–40)
Albumin/Globulin Ratio: 1.7 (ref 1.2–2.2)
Albumin: 4.3 g/dL (ref 3.9–4.9)
Alkaline Phosphatase: 91 IU/L (ref 44–121)
BUN/Creatinine Ratio: 11 (ref 9–23)
BUN: 8 mg/dL (ref 6–24)
Calcium: 9.4 mg/dL (ref 8.7–10.2)
Chloride: 103 mmol/L (ref 96–106)
Creatinine, Ser: 0.73 mg/dL (ref 0.57–1.00)
Globulin, Total: 2.5 g/dL (ref 1.5–4.5)
Glucose: 91 mg/dL (ref 70–99)
Potassium: 4.5 mmol/L (ref 3.5–5.2)
eGFR: 103 mL/min/{1.73_m2} (ref >59)

## 2023-01-29 LAB — CBC WITH DIFFERENTIAL/PLATELET
Basos: 1 %
Hematocrit: 40.5 % (ref 34.0–46.6)
Immature Grans (Abs): 0 10*3/uL (ref 0.0–0.1)
Immature Granulocytes: 0 %
Lymphocytes Absolute: 1.5 10*3/uL (ref 0.7–3.1)
MCHC: 34.8 g/dL (ref 31.5–35.7)
MCV: 85 fL (ref 79–97)
Monocytes: 6 %
Neutrophils: 60 %
Platelets: 376 10*3/uL (ref 150–450)
RBC: 4.78 x10E6/uL (ref 3.77–5.28)

## 2023-01-29 LAB — TSH+FREE T4: Free T4: 1.02 ng/dL (ref 0.82–1.77)

## 2023-01-29 LAB — MICROALBUMIN / CREATININE URINE RATIO

## 2023-01-29 LAB — LIPID PANEL
Chol/HDL Ratio: 4 ratio (ref 0.0–4.4)
HDL: 52 mg/dL (ref >39)

## 2023-01-29 LAB — VITAMIN D 25 HYDROXY (VIT D DEFICIENCY, FRACTURES): Vit D, 25-Hydroxy: 12.1 ng/mL — ABNORMAL LOW (ref 30.0–100.0)

## 2023-01-30 LAB — CMP14+EGFR
Bilirubin Total: 0.4 mg/dL (ref 0.0–1.2)
CO2: 20 mmol/L (ref 20–29)
Sodium: 137 mmol/L (ref 134–144)
Total Protein: 6.8 g/dL (ref 6.0–8.5)

## 2023-01-30 LAB — CBC WITH DIFFERENTIAL/PLATELET
Basophils Absolute: 0 10*3/uL (ref 0.0–0.2)
EOS (ABSOLUTE): 0.2 10*3/uL (ref 0.0–0.4)
Eos: 4 %
Hemoglobin: 14.1 g/dL (ref 11.1–15.9)
Lymphs: 29 %
MCH: 29.5 pg (ref 26.6–33.0)
Monocytes Absolute: 0.3 10*3/uL (ref 0.1–0.9)
Neutrophils Absolute: 3.2 10*3/uL (ref 1.4–7.0)
RDW: 12.3 % (ref 11.7–15.4)
WBC: 5.3 10*3/uL (ref 3.4–10.8)

## 2023-01-30 LAB — TSH+FREE T4: TSH: 1.07 u[IU]/mL (ref 0.450–4.500)

## 2023-01-30 LAB — LIPID PANEL
Cholesterol, Total: 207 mg/dL — ABNORMAL HIGH (ref 100–199)
LDL Chol Calc (NIH): 142 mg/dL — ABNORMAL HIGH (ref 0–99)
Triglycerides: 70 mg/dL (ref 0–149)
VLDL Cholesterol Cal: 13 mg/dL (ref 5–40)

## 2023-01-30 LAB — HEPATITIS C ANTIBODY: Hep C Virus Ab: NONREACTIVE

## 2023-01-30 LAB — HEMOGLOBIN A1C
Est. average glucose Bld gHb Est-mCnc: 126 mg/dL
Hgb A1c MFr Bld: 6 % — ABNORMAL HIGH (ref 4.8–5.6)

## 2023-01-30 LAB — MICROALBUMIN / CREATININE URINE RATIO
Microalb/Creat Ratio: 5 mg/g creat (ref 0–29)
Microalbumin, Urine: 8.6 ug/mL

## 2023-02-04 ENCOUNTER — Encounter: Payer: Self-pay | Admitting: *Deleted

## 2023-02-05 ENCOUNTER — Encounter: Payer: Self-pay | Admitting: Family Medicine

## 2023-02-05 ENCOUNTER — Ambulatory Visit: Payer: BC Managed Care – PPO | Admitting: Family Medicine

## 2023-02-05 DIAGNOSIS — M79601 Pain in right arm: Secondary | ICD-10-CM | POA: Diagnosis not present

## 2023-02-05 MED ORDER — GABAPENTIN 600 MG PO TABS: 600.0000 mg | ORAL_TABLET | Freq: Three times a day (TID) | ORAL | 2 refills | Status: DC

## 2023-02-05 NOTE — Assessment & Plan Note (Signed)
Continue right arm pain Musculoskeletal exam normal Advise patient to wear an arm splint overnight and gentle stretching throughout the day. Increased Gabapentin 600 mg
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
## 2023-02-05 NOTE — Patient Instructions (Signed)
        Great to see you today.   - Please take medications as prescribed. - Follow up with your primary health provider if any health concerns arises. - If symptoms worsen please contact your primary care provider and/or visit the emergency department.  

## 2023-02-11 ENCOUNTER — Ambulatory Visit: Payer: BC Managed Care – PPO | Admitting: Family Medicine

## 2023-02-25 DIAGNOSIS — Z01419 Encounter for gynecological examination (general) (routine) without abnormal findings: Secondary | ICD-10-CM | POA: Diagnosis not present

## 2023-06-06 ENCOUNTER — Ambulatory Visit: Payer: BC Managed Care – PPO | Admitting: Family Medicine

## 2023-06-06 ENCOUNTER — Encounter: Payer: Self-pay | Admitting: Family Medicine

## 2023-06-06 VITALS — BP 122/72 | HR 83 | Ht 63.0 in | Wt 178.1 lb

## 2023-06-06 DIAGNOSIS — M25511 Pain in right shoulder: Secondary | ICD-10-CM

## 2023-06-06 DIAGNOSIS — R7303 Prediabetes: Secondary | ICD-10-CM | POA: Diagnosis not present

## 2023-06-06 DIAGNOSIS — E8881 Metabolic syndrome: Secondary | ICD-10-CM

## 2023-06-06 DIAGNOSIS — G8929 Other chronic pain: Secondary | ICD-10-CM | POA: Diagnosis not present

## 2023-06-06 DIAGNOSIS — E7800 Pure hypercholesterolemia, unspecified: Secondary | ICD-10-CM | POA: Diagnosis not present

## 2023-06-06 MED ORDER — MELOXICAM 7.5 MG PO TABS: 7.5000 mg | ORAL_TABLET | Freq: Every day | ORAL | 0 refills | Status: DC

## 2023-06-06 NOTE — Patient Instructions (Signed)

## 2023-06-06 NOTE — Assessment & Plan Note (Signed)
Xray ordered awaiting results will follow up Trial on Mobic 7.5 mg for pain management Explained to patient Non pharmacological interventions include the use of ice or heat, rest, recommend range of motion exercises, gentle stretching.

## 2023-06-06 NOTE — Assessment & Plan Note (Signed)
Labs ordered in today's visit Advise to follow a DASH diet which includes vegetables,fruits,whole grains, fat free or low fat diary,fish,poultry,beans,nuts and seeds,vegetable oils. Find an activity that you will enjoy and start to be active at least 5 days a week for 30 minutes each day.
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
## 2023-06-07 LAB — CBC WITH DIFFERENTIAL/PLATELET
Basophils Absolute: 0 10*3/uL (ref 0.0–0.2)
Basos: 1 %
EOS (ABSOLUTE): 0.3 10*3/uL (ref 0.0–0.4)
Eos: 5 %
Hematocrit: 40.8 % (ref 34.0–46.6)
Hemoglobin: 13.8 g/dL (ref 11.1–15.9)
Immature Grans (Abs): 0 10*3/uL (ref 0.0–0.1)
Immature Granulocytes: 0 %
Lymphocytes Absolute: 1.4 10*3/uL (ref 0.7–3.1)
Lymphs: 25 %
MCH: 29.6 pg (ref 26.6–33.0)
MCHC: 33.8 g/dL (ref 31.5–35.7)
MCV: 87 fL (ref 79–97)
Monocytes Absolute: 0.4 10*3/uL (ref 0.1–0.9)
Monocytes: 6 %
Neutrophils Absolute: 3.6 10*3/uL (ref 1.4–7.0)
Neutrophils: 63 %
Platelets: 431 10*3/uL (ref 150–450)
RBC: 4.67 x10E6/uL (ref 3.77–5.28)
RDW: 11.8 % (ref 11.7–15.4)
WBC: 5.8 10*3/uL (ref 3.4–10.8)

## 2023-06-07 LAB — BMP8+EGFR
BUN/Creatinine Ratio: 12 (ref 9–23)
BUN: 8 mg/dL (ref 6–24)
CO2: 24 mmol/L (ref 20–29)
Calcium: 9.5 mg/dL (ref 8.7–10.2)
Chloride: 104 mmol/L (ref 96–106)
Creatinine, Ser: 0.68 mg/dL (ref 0.57–1.00)
Glucose: 101 mg/dL — ABNORMAL HIGH (ref 70–99)
Potassium: 4.6 mmol/L (ref 3.5–5.2)
Sodium: 142 mmol/L (ref 134–144)
eGFR: 109 mL/min/{1.73_m2} (ref >59)

## 2023-06-07 LAB — LIPID PANEL
Chol/HDL Ratio: 4.6 ratio — ABNORMAL HIGH (ref 0.0–4.4)
Cholesterol, Total: 223 mg/dL — ABNORMAL HIGH (ref 100–199)
HDL: 49 mg/dL (ref >39)
LDL Chol Calc (NIH): 156 mg/dL — ABNORMAL HIGH (ref 0–99)
Triglycerides: 102 mg/dL (ref 0–149)
VLDL Cholesterol Cal: 18 mg/dL (ref 5–40)

## 2023-06-07 LAB — HEMOGLOBIN A1C
Est. average glucose Bld gHb Est-mCnc: 131 mg/dL
Hgb A1c MFr Bld: 6.2 % — ABNORMAL HIGH (ref 4.8–5.6)

## 2023-06-17 ENCOUNTER — Ambulatory Visit: Payer: BC Managed Care – PPO | Admitting: Orthopedic Surgery

## 2023-06-18 ENCOUNTER — Telehealth: Payer: Self-pay | Admitting: Internal Medicine

## 2023-06-18 DIAGNOSIS — Z1211 Encounter for screening for malignant neoplasm of colon: Secondary | ICD-10-CM

## 2023-06-18 NOTE — Telephone Encounter (Signed)
Questionnaire is in review.

## 2023-06-19 NOTE — Telephone Encounter (Signed)
  Procedure: Colonoscopy  Height: 5'3 Weight: 179lbs        Have you had a colonoscopy before?  no  Do you have family history of colon cancer?  no  Do you have a family history of polyps? no  Previous colonoscopy with polyps removed? no  Do you have a history colorectal cancer?   no  Are you diabetic?  no  Do you have a prosthetic or mechanical heart valve? no  Do you have a pacemaker/defibrillator?   no  Have you had endocarditis/atrial fibrillation?  no  Do you use supplemental oxygen/CPAP?  no  Have you had joint replacement within the last 12 months?  no  Do you tend to be constipated or have to use laxatives?  no   Do you have history of alcohol use? If yes, how much and how often.  no  Do you have history or are you using drugs? If yes, what do are you  using?  no  Have you ever had a stroke/heart attack?  no  Have you ever had a heart or other vascular stent placed,?no  Do you take weight loss medication? no  female patients,: have you had a hysterectomy? no                              are you post menopausal?  no                              do you still have your menstrual cycle? yes    Date of last menstrual period? 06/01/23  Do you take any blood-thinning medications such as: (Plavix, aspirin, Coumadin, Aggrenox, Brilinta, Xarelto, Eliquis, Pradaxa, Savaysa or Effient)? no  If yes we need the name, milligram, dosage and who is prescribing doctor:               Current Outpatient Medications  Medication Sig Dispense Refill   meloxicam (MOBIC) 7.5 MG tablet Take 1 tablet (7.5 mg total) by mouth daily. 30 tablet 0   Multiple Vitamin (MULTIVITAMIN) tablet Take 1 tablet by mouth daily.     No current facility-administered medications for this visit.    Allergies  Allergen Reactions   Salmon [Fish Allergy] Itching, Swelling and Rash    SWELLING OF LIPS

## 2023-06-19 NOTE — Addendum Note (Signed)
Addended by: Armstead Peaks on: 06/19/2023 08:30 AM   Modules accepted: Orders

## 2023-06-24 ENCOUNTER — Ambulatory Visit (INDEPENDENT_AMBULATORY_CARE_PROVIDER_SITE_OTHER): Payer: BC Managed Care – PPO | Admitting: Sports Medicine

## 2023-06-24 ENCOUNTER — Encounter: Payer: Self-pay | Admitting: Sports Medicine

## 2023-06-24 ENCOUNTER — Other Ambulatory Visit (INDEPENDENT_AMBULATORY_CARE_PROVIDER_SITE_OTHER): Payer: BC Managed Care – PPO

## 2023-06-24 DIAGNOSIS — M7701 Medial epicondylitis, right elbow: Secondary | ICD-10-CM

## 2023-06-24 DIAGNOSIS — M25511 Pain in right shoulder: Secondary | ICD-10-CM

## 2023-06-24 DIAGNOSIS — M792 Neuralgia and neuritis, unspecified: Secondary | ICD-10-CM

## 2023-06-24 DIAGNOSIS — G8929 Other chronic pain: Secondary | ICD-10-CM
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
## 2023-06-24 NOTE — Progress Notes (Deleted)
Christine Fitzgerald - 47 y.o. female MRN [id number]  Date of birth: April 25, 1976  Office Visit Note: Visit Date: 06/24/2023 PCP: Rica Records, FNP Referred by: Wylene Men*  Subjective: Chief Complaint  Patient presents with  . Right Shoulder - Pain  . Right Elbow - Pain   HPI: Christine Fitzgerald is a pleasant 47 y.o. female who presents today for ***  Pertinent ROS were reviewed with the patient and found to be negative unless otherwise specified above in HPI.   Assessment & Plan: Visit Diagnoses:  1. Chronic right shoulder pain   2. Radicular pain in right arm     Plan: ***  Follow-up: No follow-ups on file.   Meds & Orders: No orders of the defined types were placed in this encounter.   Orders Placed This Encounter  Procedures  . XR Cervical Spine 2 or 3 views  . XR Shoulder Right     Procedures: No procedures performed      Clinical History: No specialty comments available.  She reports that she has never smoked. She has never used smokeless tobacco.  Recent Labs    01/28/23 1300 06/06/23 0943  HGBA1C 6.0* 6.2*    Objective:   Vital Signs: There were no vitals taken for this visit.  Physical Exam  Gen: Well-appearing, in no acute distress; non-toxic CV: Regular Rate. Well-perfused. Warm.  Resp: Breathing unlabored on room air; no wheezing. Psych: Fluid speech in conversation; appropriate affect; normal thought process Neuro: Sensation intact throughout. No gross coordination deficits.   Ortho Exam - ***  Imaging: No results found.  Past Medical/Family/Surgical/Social History: Medications & Allergies reviewed per EMR, new medications updated. Patient Active Problem List   Diagnosis Date Noted  . Right shoulder pain 06/06/2023  . Right arm pain 01/28/2023  . Dyslipidemia (high LDL; low HDL) 04/16/2016  . Prediabetes 04/16/2016  . Need for prophylactic vaccination and inoculation against influenza 12/17/2015  . GAD  (generalized anxiety disorder) 12/14/2015  . Headache disorder 08/21/2014  . Depression 12/30/2013  . Metabolic syndrome X 11/07/2013  . Obesity (BMI 30.0-34.9) 11/07/2013  . 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 Thyromegaly 11/07/2013  . Insomnia 11/05/2013   Past Medical History:  Diagnosis Date  . Anxiety   . Depression   . Headache   . History of kidney stones    passed stones, no surgery related  . History of migraine headaches   . Hyperlipidemia    no meds, diet controlled  . Pre-diabetes    no meds, diet controlled  . Rash of neck 02/04/2015   Resolved - from sun exposure, per pt.  . Seasonal allergies   . Stenosing tenosynovitis of finger of right hand 01/2015   middle finger  . SVD (spontaneous vaginal delivery)    x 1   Family History  Problem Relation Age of Onset  . Diabetes Mother   . Hypertension Mother   . Hyperlipidemia Mother   . Glaucoma Mother   . Prostatitis Father    Past Surgical History:  Procedure Laterality Date  . CARPAL TUNNEL RELEASE Right 02/14/2015   Procedure: RIGHT CARPAL TUNNEL RELEASE;  Surgeon: Cindee Salt, MD;  Location: [facility];  Service: Orthopedics;  Laterality: Right;  . DILATATION & CURRETTAGE/HYSTEROSCOPY WITH RESECTOCOPE N/A 01/20/2014   Procedure: DILATATION & CURETTAGE/HYSTEROSCOPY WITH RESECTOCOPE;  Surgeon: Serita Kyle, MD;  Location: [facility];  Service: Gynecology;  Laterality: N/A;  2 1/2 hrs. total  .  DILITATION & CURRETTAGE/HYSTROSCOPY WITH HYDROTHERMAL ABLATION N/A 03/04/2018   Procedure: DILATATION & CURETTAGE/HYSTEROSCOPY WITH HYDROTHERMAL ABLATION;  Surgeon: Myna Hidalgo, DO;  Location: [facility];  Service: Gynecology;  Laterality: N/A;  . LAPAROSCOPY N/A 01/20/2014   Procedure: LAPAROSCOPY DIAGNOSTIC;  Surgeon: Serita Kyle, MD;  Location: [facility];  Service: Gynecology;  Laterality: N/A;  . ROBOTIC ASSISTED LAPAROSCOPIC LYSIS OF ADHESION N/A 01/20/2014   Procedure:  ROBOTIC ASSISTED  LAPAROSCOPIC EXTENSIVE  LYSIS OF ADHESION;  BILATERAL SALPINGECTOMY;  Surgeon: Serita Kyle, MD;  Location: [facility];  Service: Gynecology;  Laterality: N/A;  . TRIGGER FINGER RELEASE Right 02/14/2015   Procedure: RELEASE A-1 PULLY RIGHT MIDDLE FINGER;  Surgeon: Cindee Salt, MD;  Location: [facility];  Service: Orthopedics;  Laterality: Right;  . WISDOM TOOTH EXTRACTION     Social History   Occupational History  . Not on file  Tobacco Use  . Smoking status: Never  . Smokeless tobacco: Never  Vaping Use  . Vaping status: Never Used  Substance and Sexual Activity  . Alcohol use: Yes    Comment: occasionally  . Drug use: No  . Sexual activity: Yes    Birth control/protection: None

## 2023-07-07 ENCOUNTER — Other Ambulatory Visit: Payer: Self-pay | Admitting: Family Medicine

## 2023-07-07 DIAGNOSIS — G8929 Other chronic pain: Secondary | ICD-10-CM

## 2023-07-16 NOTE — Telephone Encounter (Signed)
LMOVM to call back to schedule 

## 2023-07-16 NOTE — Telephone Encounter (Signed)
Ok to schedule. ASA 2. Needs UPT prior.

## 2023-07-31 NOTE — Telephone Encounter (Signed)
LMOVM to call back. Letter mailed. ?

## 2023-08-03 ENCOUNTER — Other Ambulatory Visit: Payer: Self-pay | Admitting: Family Medicine

## 2023-08-03 DIAGNOSIS — G8929 Other chronic pain: Secondary | ICD-10-CM

## 2023-08-05 NOTE — Telephone Encounter (Signed)
Noted in referral.
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
## 2023-09-02 ENCOUNTER — Encounter: Payer: Self-pay | Admitting: Rehabilitative and Restorative Service Providers"

## 2023-09-02 ENCOUNTER — Ambulatory Visit: Payer: BC Managed Care – PPO | Admitting: Rehabilitative and Restorative Service Providers"

## 2023-09-02 ENCOUNTER — Other Ambulatory Visit: Payer: Self-pay

## 2023-09-02 DIAGNOSIS — M25521 Pain in right elbow: Secondary | ICD-10-CM | POA: Diagnosis not present

## 2023-09-02 DIAGNOSIS — M6281 Muscle weakness (generalized): Secondary | ICD-10-CM | POA: Diagnosis not present

## 2023-09-02 DIAGNOSIS — M25511 Pain in right shoulder: Secondary | ICD-10-CM

## 2023-09-02 DIAGNOSIS — G8929 Other chronic pain: Secondary | ICD-10-CM

## 2023-09-02 MED ORDER — PEG 3350-KCL-NA BICARB-NACL 420 G PO SOLR: 4000.0000 mL | Freq: Once | ORAL | 0 refills | Status: DC

## 2023-09-02 NOTE — Addendum Note (Signed)
Addended by: Armstead Peaks on: 09/02/2023 12:00 PM   Modules accepted: Orders

## 2023-09-02 NOTE — Telephone Encounter (Signed)
Patient called in. She has been scheduled with Dr. Marletta Lor 12/31. Aware will send instructions to her. Rx for prep sent to pharmacy.

## 2023-09-03 ENCOUNTER — Encounter: Payer: Self-pay | Admitting: *Deleted

## 2023-09-03 NOTE — Telephone Encounter (Signed)
Referral completed, [facility] apt letter sent to PCP

## 2023-09-05 ENCOUNTER — Encounter: Payer: Self-pay | Admitting: Rehabilitative and Restorative Service Providers"

## 2023-09-05 ENCOUNTER — Other Ambulatory Visit: Payer: Self-pay | Admitting: Family Medicine

## 2023-09-05 ENCOUNTER — Ambulatory Visit (INDEPENDENT_AMBULATORY_CARE_PROVIDER_SITE_OTHER): Payer: BC Managed Care – PPO | Admitting: Rehabilitative and Restorative Service Providers"

## 2023-09-05 DIAGNOSIS — M25521 Pain in right elbow: Secondary | ICD-10-CM

## 2023-09-05 DIAGNOSIS — M6281 Muscle weakness (generalized): Secondary | ICD-10-CM | POA: Diagnosis not present

## 2023-09-05 DIAGNOSIS — M25511 Pain in right shoulder: Secondary | ICD-10-CM

## 2023-09-05 DIAGNOSIS — G8929 Other chronic pain: Secondary | ICD-10-CM | POA: Diagnosis not present
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
## 2023-09-08 ENCOUNTER — Ambulatory Visit (INDEPENDENT_AMBULATORY_CARE_PROVIDER_SITE_OTHER): Payer: BC Managed Care – PPO | Admitting: Rehabilitative and Restorative Service Providers"

## 2023-09-08 ENCOUNTER — Encounter: Payer: Self-pay | Admitting: Rehabilitative and Restorative Service Providers"

## 2023-09-08 DIAGNOSIS — M6281 Muscle weakness (generalized): Secondary | ICD-10-CM

## 2023-09-08 DIAGNOSIS — M25511 Pain in right shoulder: Secondary | ICD-10-CM

## 2023-09-08 DIAGNOSIS — M25521 Pain in right elbow: Secondary | ICD-10-CM | POA: Diagnosis not present

## 2023-09-08 DIAGNOSIS — G8929 Other chronic pain: Secondary | ICD-10-CM
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
## 2023-09-15 ENCOUNTER — Other Ambulatory Visit: Payer: Self-pay | Admitting: Obstetrics and Gynecology

## 2023-09-15 DIAGNOSIS — Z1231 Encounter for screening mammogram for malignant neoplasm of breast: Secondary | ICD-10-CM

## 2023-09-16 ENCOUNTER — Encounter: Payer: BC Managed Care – PPO | Admitting: Rehabilitative and Restorative Service Providers"

## 2023-09-18 ENCOUNTER — Ambulatory Visit: Payer: BC Managed Care – PPO | Admitting: Rehabilitative and Restorative Service Providers"

## 2023-09-18 DIAGNOSIS — M25521 Pain in right elbow: Secondary | ICD-10-CM | POA: Diagnosis not present

## 2023-09-18 DIAGNOSIS — G8929 Other chronic pain: Secondary | ICD-10-CM

## 2023-09-18 DIAGNOSIS — M6281 Muscle weakness (generalized): Secondary | ICD-10-CM | POA: Diagnosis not present

## 2023-09-18 DIAGNOSIS — M25511 Pain in right shoulder: Secondary | ICD-10-CM | POA: Diagnosis not present
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
## 2023-09-22 ENCOUNTER — Ambulatory Visit: Payer: BC Managed Care – PPO | Admitting: Rehabilitative and Restorative Service Providers"

## 2023-09-22 ENCOUNTER — Encounter: Payer: Self-pay | Admitting: Rehabilitative and Restorative Service Providers"

## 2023-09-22 DIAGNOSIS — G8929 Other chronic pain: Secondary | ICD-10-CM | POA: Diagnosis not present

## 2023-09-22 DIAGNOSIS — M6281 Muscle weakness (generalized): Secondary | ICD-10-CM

## 2023-09-22 DIAGNOSIS — M25511 Pain in right shoulder: Secondary | ICD-10-CM | POA: Diagnosis not present

## 2023-09-22 DIAGNOSIS — M25521 Pain in right elbow: Secondary | ICD-10-CM
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
## 2023-09-24 ENCOUNTER — Ambulatory Visit: Payer: BC Managed Care – PPO | Admitting: Rehabilitative and Restorative Service Providers"

## 2023-09-24 ENCOUNTER — Encounter: Payer: Self-pay | Admitting: Rehabilitative and Restorative Service Providers"

## 2023-09-24 DIAGNOSIS — G8929 Other chronic pain: Secondary | ICD-10-CM | POA: Diagnosis not present

## 2023-09-24 DIAGNOSIS — M6281 Muscle weakness (generalized): Secondary | ICD-10-CM | POA: Diagnosis not present

## 2023-09-24 DIAGNOSIS — M25521 Pain in right elbow: Secondary | ICD-10-CM

## 2023-09-24 DIAGNOSIS — M25511 Pain in right shoulder: Secondary | ICD-10-CM
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
## 2023-10-03 ENCOUNTER — Ambulatory Visit: Payer: BC Managed Care – PPO | Admitting: Family Medicine

## 2023-10-05 NOTE — Progress Notes (Unsigned)
   Established Patient Office Visit   Subjective  Patient ID: Christine Fitzgerald, female    DOB: [birth year]  Age: 47 y.o. MRN: [id number]  No chief complaint on file.   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  HPI  ROS    Objective:     There were no vitals taken for this visit. {Vitals History (Optional):23777}  Physical Exam   No results found for any visits on 10/06/23.  The 10-year ASCVD risk score (Arnett DK, et al., 2019) is: 1.3%    Assessment & Plan:  There are no diagnoses linked to this encounter.  No follow-ups on file.   Cruzita Lederer Newman Nip, FNP

## 2023-10-05 NOTE — Patient Instructions (Signed)

## 2023-10-06 ENCOUNTER — Encounter (INDEPENDENT_AMBULATORY_CARE_PROVIDER_SITE_OTHER): Payer: Self-pay | Admitting: Family Medicine

## 2023-10-06 DIAGNOSIS — E782 Mixed hyperlipidemia: Secondary | ICD-10-CM

## 2023-10-06 DIAGNOSIS — R7303 Prediabetes: Secondary | ICD-10-CM

## 2023-10-06 NOTE — Progress Notes (Signed)
Canceled.

## 2023-10-09 ENCOUNTER — Encounter: Payer: Self-pay | Admitting: *Deleted

## 2023-10-09 ENCOUNTER — Telehealth: Payer: Self-pay | Admitting: *Deleted

## 2023-10-09 NOTE — Telephone Encounter (Signed)
Pt left vm stating that she hasn't received her instructions for upcoming procedure.  LMTRC

## 2023-10-09 NOTE — Telephone Encounter (Signed)
Pt returned call. Instructions sent via MyChart to pt.

## 2023-10-13 ENCOUNTER — Other Ambulatory Visit: Payer: Self-pay | Admitting: *Deleted

## 2023-10-13 ENCOUNTER — Other Ambulatory Visit (HOSPITAL_COMMUNITY)
Admission: RE | Admit: 2023-10-13 | Discharge: 2023-10-13 | Disposition: A | Discharge status: Home / Self Care | Payer: BC Managed Care – PPO | Source: Ambulatory Visit | Attending: Internal Medicine | Admitting: Internal Medicine

## 2023-10-13 DIAGNOSIS — F419 Anxiety disorder, unspecified: Secondary | ICD-10-CM | POA: Diagnosis not present

## 2023-10-13 DIAGNOSIS — R519 Headache, unspecified: Secondary | ICD-10-CM | POA: Diagnosis not present

## 2023-10-13 DIAGNOSIS — F32A Depression, unspecified: Secondary | ICD-10-CM | POA: Diagnosis not present

## 2023-10-13 DIAGNOSIS — Z1211 Encounter for screening for malignant neoplasm of colon: Secondary | ICD-10-CM | POA: Insufficient documentation

## 2023-10-13 DIAGNOSIS — D125 Benign neoplasm of sigmoid colon: Secondary | ICD-10-CM | POA: Diagnosis not present

## 2023-10-13 DIAGNOSIS — E785 Hyperlipidemia, unspecified: Secondary | ICD-10-CM | POA: Diagnosis not present

## 2023-10-13 DIAGNOSIS — I10 Essential (primary) hypertension: Secondary | ICD-10-CM | POA: Diagnosis not present

## 2023-10-13 DIAGNOSIS — Z791 Long term (current) use of non-steroidal anti-inflammatories (NSAID): Secondary | ICD-10-CM | POA: Diagnosis not present

## 2023-10-13 LAB — PREGNANCY, URINE: Preg Test, Ur: NEGATIVE

## 2023-10-13 MED ORDER — PEG 3350-KCL-NA BICARB-NACL 420 G PO SOLR: 4000.0000 mL | Freq: Once | ORAL | 0 refills | Status: AC

## 2023-10-14 ENCOUNTER — Ambulatory Visit (HOSPITAL_COMMUNITY): Payer: Self-pay | Admitting: Anesthesiology

## 2023-10-14 ENCOUNTER — Ambulatory Visit
Admission: RE | Admit: 2023-10-14 | Discharge: 2023-10-14 | Disposition: A | Discharge status: Home / Self Care | Payer: BC Managed Care – PPO | Source: Ambulatory Visit | Attending: Obstetrics and Gynecology | Admitting: Obstetrics and Gynecology

## 2023-10-14 ENCOUNTER — Encounter (HOSPITAL_COMMUNITY): Payer: Self-pay | Admitting: Internal Medicine

## 2023-10-14 ENCOUNTER — Ambulatory Visit (HOSPITAL_COMMUNITY)
Admission: RE | Admit: 2023-10-14 | Discharge: 2023-10-14 | Disposition: A | Discharge status: Home / Self Care | Payer: BC Managed Care – PPO | Attending: Internal Medicine | Admitting: Internal Medicine

## 2023-10-14 ENCOUNTER — Other Ambulatory Visit: Payer: Self-pay

## 2023-10-14 ENCOUNTER — Encounter (HOSPITAL_COMMUNITY)
Admission: RE | Disposition: A | Discharge status: Home / Self Care | Payer: Self-pay | Source: Home / Self Care | Attending: Internal Medicine

## 2023-10-14 DIAGNOSIS — E785 Hyperlipidemia, unspecified: Secondary | ICD-10-CM | POA: Diagnosis not present

## 2023-10-14 DIAGNOSIS — Z1231 Encounter for screening mammogram for malignant neoplasm of breast: Secondary | ICD-10-CM | POA: Diagnosis not present

## 2023-10-14 DIAGNOSIS — F419 Anxiety disorder, unspecified: Secondary | ICD-10-CM | POA: Insufficient documentation

## 2023-10-14 DIAGNOSIS — I10 Essential (primary) hypertension: Secondary | ICD-10-CM | POA: Diagnosis not present

## 2023-10-14 DIAGNOSIS — K635 Polyp of colon: Secondary | ICD-10-CM | POA: Diagnosis not present

## 2023-10-14 DIAGNOSIS — R519 Headache, unspecified: Secondary | ICD-10-CM | POA: Insufficient documentation

## 2023-10-14 DIAGNOSIS — Z791 Long term (current) use of non-steroidal anti-inflammatories (NSAID): Secondary | ICD-10-CM | POA: Insufficient documentation

## 2023-10-14 DIAGNOSIS — F32A Depression, unspecified: Secondary | ICD-10-CM | POA: Diagnosis not present

## 2023-10-14 DIAGNOSIS — Z1211 Encounter for screening for malignant neoplasm of colon: Secondary | ICD-10-CM | POA: Diagnosis not present

## 2023-10-14 DIAGNOSIS — D125 Benign neoplasm of sigmoid colon: Secondary | ICD-10-CM | POA: Insufficient documentation

## 2023-10-14 DIAGNOSIS — Z139 Encounter for screening, unspecified: Secondary | ICD-10-CM | POA: Diagnosis not present

## 2023-10-14 HISTORY — PX: POLYPECTOMY: SHX149

## 2023-10-14 HISTORY — PX: COLONOSCOPY WITH PROPOFOL: SHX5780

## 2023-10-14 SURGERY — COLONOSCOPY WITH PROPOFOL
Anesthesia: General

## 2023-10-14 MED ORDER — LIDOCAINE HCL (PF) 2 % IJ SOLN
INTRAMUSCULAR | Status: DC | PRN
  Administered 2023-10-14: 50 mg via INTRADERMAL

## 2023-10-14 MED ORDER — LACTATED RINGERS IV SOLN: INTRAVENOUS | Status: DC

## 2023-10-14 MED ORDER — PROPOFOL 500 MG/50ML IV EMUL
INTRAVENOUS | Status: DC | PRN
  Administered 2023-10-14: 150 ug/kg/min via INTRAVENOUS

## 2023-10-14 MED ORDER — STERILE WATER FOR IRRIGATION IR SOLN
Status: DC | PRN
  Administered 2023-10-14: 60 mL

## 2023-10-14 MED ORDER — PROPOFOL 10 MG/ML IV BOLUS
INTRAVENOUS | Status: DC | PRN
  Administered 2023-10-14: 100 mg via INTRAVENOUS
  Administered 2023-10-14 (×2): 50 mg via INTRAVENOUS

## 2023-10-14 NOTE — Anesthesia Preprocedure Evaluation (Signed)
 Anesthesia Evaluation  Patient identified by MRN, date of birth, ID band Patient awake    Reviewed: Allergy & Precautions, H&P , NPO status , Patient's Chart, lab work & pertinent test results, reviewed documented beta blocker date and time   Airway Mallampati: II  TM Distance: >3 FB Neck ROM: full    Dental no notable dental hx.    Pulmonary neg pulmonary ROS   Pulmonary exam normal breath sounds clear to auscultation       Cardiovascular Exercise Tolerance: Good hypertension, negative cardio ROS  Rhythm:regular Rate:Normal     Neuro/Psych  Headaches PSYCHIATRIC DISORDERS Anxiety Depression    negative neurological ROS  negative psych ROS   GI/Hepatic negative GI ROS, Neg liver ROS,,,  Endo/Other  negative endocrine ROS    Renal/GU negative Renal ROS  negative genitourinary   Musculoskeletal   Abdominal   Peds  Hematology negative hematology ROS (+)   Anesthesia Other Findings   Reproductive/Obstetrics negative OB ROS                             Anesthesia Physical Anesthesia Plan  ASA: 2  Anesthesia Plan: General   Post-op Pain Management:    Induction:   PONV Risk Score and Plan: Propofol  infusion  Airway Management Planned:   Additional Equipment:   Intra-op Plan:   Post-operative Plan:   Informed Consent: I have reviewed the patients History and Physical, chart, labs and discussed the procedure including the risks, benefits and alternatives for the proposed anesthesia with the patient or authorized representative who has indicated his/her understanding and acceptance.     Dental Advisory Given  Plan Discussed with: CRNA  Anesthesia Plan Comments:        Anesthesia Quick Evaluation

## 2023-10-14 NOTE — Anesthesia Procedure Notes (Signed)
 Date/Time: 10/14/2023 9:33 AM  Performed by: Eliodoro Deward FALCON, CRNAPre-anesthesia Checklist: Patient identified, Emergency Drugs available, Suction available and Patient being monitored Patient Re-evaluated:Patient Re-evaluated prior to induction Oxygen Delivery Method: Nasal cannula Induction Type: IV induction Placement Confirmation: positive ETCO2

## 2023-10-14 NOTE — Transfer of Care (Signed)
 Immediate Anesthesia Transfer of Care Note  Patient: Christine Fitzgerald  Procedure(s) Performed: COLONOSCOPY WITH PROPOFOL  POLYPECTOMY INTESTINAL  Patient Location: Endoscopy Unit  Anesthesia Type:General  Level of Consciousness: awake  Airway & Oxygen Therapy: Patient Spontanous Breathing  Post-op Assessment: Report given to RN and Post -op Vital signs reviewed and stable  Post vital signs: Reviewed and stable  Last Vitals:  Vitals Value Taken Time  BP    Temp    Pulse    Resp    SpO2      Last Pain:  Vitals:   10/14/23 0934  TempSrc:   PainSc: 0-No pain      Patients Stated Pain Goal: 10 (10/14/23 0909)  Complications: No notable events documented.

## 2023-10-14 NOTE — Discharge Instructions (Addendum)
  Colonoscopy Discharge Instructions  Read the instructions outlined below and refer to this sheet in the next few weeks. These discharge instructions provide you with general information on caring for yourself after you leave the hospital. Your doctor may also give you specific instructions. While your treatment has been planned according to the most current medical practices available, unavoidable complications occasionally occur.   ACTIVITY You may resume your regular activity, but move at a slower pace for the next 24 hours.  Take frequent rest periods for the next 24 hours.  Walking will help get rid of the air and reduce the bloated feeling in your belly (abdomen).  No driving for 24 hours (because of the medicine (anesthesia) used during the test).   Do not sign any important legal documents or operate any machinery for 24 hours (because of the anesthesia used during the test).  NUTRITION Drink plenty of fluids.  You may resume your normal diet as instructed by your doctor.  Begin with a light meal and progress to your normal diet. Heavy or fried foods are harder to digest and may make you feel sick to your stomach (nauseated).  Avoid alcoholic beverages for 24 hours or as instructed.  MEDICATIONS You may resume your normal medications unless your doctor tells you otherwise.  WHAT YOU CAN EXPECT TODAY Some feelings of bloating in the abdomen.  Passage of more gas than usual.  Spotting of blood in your stool or on the toilet paper.  IF YOU HAD POLYPS REMOVED DURING THE COLONOSCOPY: No aspirin products for 7 days or as instructed.  No alcohol for 7 days or as instructed.  Eat a soft diet for the next 24 hours.  FINDING OUT THE RESULTS OF YOUR TEST Not all test results are available during your visit. If your test results are not back during the visit, make an appointment with your caregiver to find out the results. Do not assume everything is normal if you have not heard from your  caregiver or the medical facility. It is important for you to follow up on all of your test results.  SEEK IMMEDIATE MEDICAL ATTENTION IF: You have more than a spotting of blood in your stool.  Your belly is swollen (abdominal distention).  You are nauseated or vomiting.  You have a temperature over 101.  You have abdominal pain or discomfort that is severe or gets worse throughout the day.   Your colonoscopy revealed 3 polyp(s) which I removed successfully. Await pathology results, my office will contact you. I recommend repeating colonoscopy in 5-7 years for surveillance purposes depending on path results. Follow up with GI as needed.     I hope you have a great rest of your week!  Carlin POUR. Cindie, D.O. Gastroenterology and Hepatology [facility]

## 2023-10-14 NOTE — H&P (Signed)
 Primary Care Physician:  Terry Wilhelmena Lloyd Hilario, FNP Primary Gastroenterologist:  Dr. Cindie  Pre-Procedure History & Physical: HPI:  Christine Fitzgerald is a 47 y.o. female is here for first ever colonoscopy for colon cancer screening purposes.  Patient denies any family history of colorectal cancer.  No melena or hematochezia.  No abdominal pain or unintentional weight loss.  No change in bowel habits.  Overall feels well from a GI standpoint.  Past Medical History:  Diagnosis Date   Anxiety    Depression    Headache    History of kidney stones    passed stones, no surgery related   History of migraine headaches    Hyperlipidemia    no meds, diet controlled   Pre-diabetes    no meds, diet controlled   Rash of neck 02/04/2015   Resolved - from sun exposure, per pt.   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 total   DILITATION & CURRETTAGE/HYSTROSCOPY WITH HYDROTHERMAL ABLATION N/A 03/04/2018   Procedure: DILATATION & CURETTAGE/HYSTEROSCOPY WITH HYDROTHERMAL ABLATION;  Surgeon: Ozan, Jennifer, DO;  Location: [facility];  Service: Gynecology;  Laterality: N/A;   LAPAROSCOPY N/A 01/20/2014   Procedure: LAPAROSCOPY DIAGNOSTIC;  Surgeon: Dickie DELENA Carder, MD;  Location: [facility];  Service: Gynecology;  Laterality: N/A;   ROBOTIC ASSISTED LAPAROSCOPIC LYSIS OF ADHESION N/A 01/20/2014   Procedure:  ROBOTIC ASSISTED  LAPAROSCOPIC EXTENSIVE  LYSIS OF ADHESION;  BILATERAL SALPINGECTOMY;  Surgeon: Dickie DELENA Carder, MD;  Location: [facility];  Service: Gynecology;  Laterality: N/A;   TRIGGER FINGER RELEASE Right 02/14/2015   Procedure: RELEASE A-1 PULLY RIGHT MIDDLE FINGER;  Surgeon: Arley Curia, MD;  Location: [facility];  Service: Orthopedics;  Laterality: Right;   WISDOM TOOTH EXTRACTION      Prior to Admission medications   Medication Sig Start Date End Date Taking? Authorizing Provider  Multiple Vitamin (MULTIVITAMIN) tablet Take 1 tablet by mouth daily.   Yes [provider]  meloxicam  (MOBIC ) 7.5 MG tablet TAKE 1 TABLET BY MOUTH EVERY DAY 09/05/23   Del Wilhelmena Lloyd Hilario, FNP    Allergies as of 09/02/2023 - Review Complete 09/02/2023  Allergen Reaction Noted   Salmon [fish allergy] Itching, Swelling, and Rash 11/07/2013    Family History  Problem Relation Age of Onset   Diabetes Mother    Hypertension Mother    Hyperlipidemia Mother    Glaucoma Mother    Prostatitis Father     Social History   Socioeconomic History   Marital status: Married    Spouse name: Not on file   Number of children: Not on file   Years of education: Not on file   Highest education level: Bachelor's degree (e.g., BA, AB, BS)  Occupational History   Not on file  Tobacco Use   Smoking status: Never   Smokeless tobacco: Never  Vaping Use   Vaping status: Never Used  Substance  and Sexual Activity   Alcohol use: Yes    Comment: occasionally   Drug use: No   Sexual activity: Yes    Birth control/protection: None  Other Topics Concern   Not on file  Social History Narrative   Not on file   Social Drivers of Health   Financial Resource Strain: Low Risk  (02/04/2023)   Overall Financial Resource Strain (CARDIA)    Difficulty of Paying Living Expenses: Not very hard  Food Insecurity: No Food Insecurity (02/04/2023)   Hunger Vital Sign    Worried About Running Out of Food in the Last  Year: Never true    Ran Out of Food in the Last Year: Never true  Transportation Needs: No Transportation Needs (02/04/2023)   PRAPARE - Administrator, Civil Service (Medical): No    Lack of Transportation (Non-Medical): No  Physical Activity: Unknown (02/04/2023)   Exercise Vital Sign    Days of Exercise per Week: 0 days    Minutes of Exercise per Session: Not on file  Stress: Stress Concern Present (02/04/2023)   [employer] of Occupational Health - Occupational Stress Questionnaire    Feeling of Stress : To some extent  Social Connections: Socially Integrated (02/04/2023)   Social Connection and Isolation Panel [NHANES]    Frequency of Communication with Friends and Family: More than three times a week    Frequency of Social Gatherings with Friends and Family: More than three times a week    Attends Religious Services: More than 4 times per year    Active Member of Clubs or Organizations: Yes    Attends Engineer, Structural: More than 4 times per year    Marital Status: Married  Catering Manager Violence: Not on file    Review of Systems: See HPI, otherwise negative ROS  Physical Exam: Vital signs in last 24 hours: Temp:  [98.6 F (37 C)] 98.6 F (37 C) (12/31 0909) Pulse Rate:  [88] 88 (12/31 0909) Resp:  [17] 17 (12/31 0909) BP: (148)/(91) 148/91 (12/31 0909) SpO2:  [100 %] 100 % (12/31 0909) Weight:  [89.4 kg] 89.4 kg (12/31 0909)   General:   Alert,  Well-developed, well-nourished, pleasant and cooperative in NAD Head:  Normocephalic and atraumatic. Eyes:  Sclera clear, no icterus.   Conjunctiva pink. Ears:  Normal auditory acuity. Nose:  No deformity, discharge,  or lesions. Msk:  Symmetrical without gross deformities. Normal posture. Extremities:  Without clubbing or edema. Neurologic:  Alert and  oriented x4;  grossly normal neurologically. Skin:  Intact without significant lesions or rashes. Psych:  Alert and cooperative. Normal mood  and affect.  Impression/Plan: Christine Fitzgerald is here for a colonoscopy to be performed for colon cancer screening purposes.  The risks of the procedure including infection, bleed, or perforation as well as benefits, limitations, alternatives and imponderables have been reviewed with the patient. Questions have been answered. All parties agreeable.

## 2023-10-14 NOTE — Anesthesia Postprocedure Evaluation (Signed)
 Anesthesia Post Note  Patient: Christine Fitzgerald  Procedure(s) Performed: COLONOSCOPY WITH PROPOFOL  POLYPECTOMY INTESTINAL  Patient location during evaluation: Phase II Anesthesia Type: General Level of consciousness: awake Pain management: pain level controlled Vital Signs Assessment: post-procedure vital signs reviewed and stable Respiratory status: spontaneous breathing and respiratory function stable Cardiovascular status: blood pressure returned to baseline and stable Postop Assessment: no headache and no apparent nausea or vomiting Anesthetic complications: no Comments: Late entry   No notable events documented.   Last Vitals:  Vitals:   10/14/23 0909 10/14/23 0956  BP: (!) 148/91 94/60  Pulse: 88 (!) 104  Resp: 17 19  Temp: 37 C (!) 36.3 C  SpO2: 100% 99%    Last Pain:  Vitals:   10/14/23 0956  TempSrc: Axillary  PainSc: 0-No pain                 Yvonna JINNY Bosworth

## 2023-10-14 NOTE — Op Note (Signed)
 [facility] Patient Name: Christine Fitzgerald Procedure Date: 10/14/2023 9:20 AM MRN: [id number] Date of Birth: [birth year] Attending MD: Carlin POUR. Cindie , [place], [id number] CSN: [id number] Age: 47 Admit Type: Outpatient Procedure:                Colonoscopy Indications:              Screening for colorectal malignant neoplasm Providers:                Carlin POUR. Cindie, DO, Crystal Page, Bascom Blush Referring MD:             Carlin POUR. Cindie, DO Medicines:                See the Anesthesia note for documentation of the                            administered medications Complications:            No immediate complications. Estimated Blood Loss:     Estimated blood loss was minimal. Procedure:                Pre-Anesthesia Assessment:                           - The anesthesia plan was to use monitored                            anesthesia care (MAC).                           After obtaining informed consent, the colonoscope                            was passed under direct vision. Throughout the                            procedure, the patient's blood pressure, pulse, and                            oxygen saturations were monitored continuously. The                            [id number]-[id number] ([id number]) was introduced through the                            anus and advanced to the the cecum, identified by                            appendiceal orifice and ileocecal valve. The                            colonoscopy was performed without difficulty. The                            patient tolerated the procedure well. The quality  of the bowel preparation was evaluated using the                            BBPS [facility] Bowel Preparation Scale) with scores                            of: Right Colon = 3, Transverse Colon = 3 and Left                            Colon = 3 (entire mucosa seen well with no residual                            staining, small fragments of  stool or opaque                            liquid). The total BBPS score equals 9. Scope In: 9:38:16 AM Scope Out: 9:52:12 AM Scope Withdrawal Time: 0 hours 10 minutes 30 seconds  Total Procedure Duration: 0 hours 13 minutes 56 seconds  Findings:      Three sessile polyps were found in the sigmoid colon. The polyps were 4       to 6 mm in size. These polyps were removed with a cold snare. Resection       and retrieval were complete.      The exam was otherwise without abnormality. Impression:               - Three 4 to 6 mm polyps in the sigmoid colon,                            removed with a cold snare. Resected and retrieved.                           - The examination was otherwise normal. Moderate Sedation:      Per Anesthesia Care Recommendation:           - Patient has a contact number available for                            emergencies. The signs and symptoms of potential                            delayed complications were discussed with the                            patient. Return to normal activities tomorrow.                            Written discharge instructions were provided to the                            patient.                           - Resume previous diet.                           -  Continue present medications.                           - Await pathology results.                           - Repeat colonoscopy in 5-7 years for surveillance                            depedning on path results.                           - Return to GI clinic PRN. Procedure Code(s):        --- Professional ---                           [id number], Colonoscopy, flexible; with removal of                            tumor(s), polyp(s), or other lesion(s) by snare                            technique Diagnosis Code(s):        --- Professional ---                           Z12.11, Encounter for screening for malignant                            neoplasm of colon                            D12.5, Benign neoplasm of sigmoid colon CPT copyright 2022 American Medical Association. All rights reserved. The codes documented in this report are preliminary and upon coder review may  be revised to meet current compliance requirements. Carlin POUR. Cindie, DO Carlin POUR. Cindie, DO 10/14/2023 9:54:43 AM This report has been signed electronically. Number of Addenda: 0

## 2023-10-16 NOTE — Patient Instructions (Signed)

## 2023-10-16 NOTE — Progress Notes (Signed)
 Established Patient Office Visit   Subjective  Patient ID: Christine Fitzgerald, female    DOB: Jan 18, 1976  Age: 48 y.o. MRN: [id number]  Chief Complaint  Patient presents with   Follow-up    4 mopre-diabetes, hyperlipidemia. Has rings on skin of ankles. Does not wear tall socks so not sure where they are originating from.     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  HPI Patient presents to the clinic for chronic follow up. For the details of today's visit, please refer to assessment and plan.    Review of Systems  Constitutional:  Negative for chills and fever.  Eyes:  Negative for blurred vision.  Respiratory:  Negative for shortness of breath.   Cardiovascular:  Negative for chest pain.  Neurological:  Negative for dizziness and headaches.      Objective:     BP 138/85   Pulse 96   Ht 5' 3 (1.6 m)   Wt 199 lb 1.9 oz (90.3 kg)   LMP 09/24/2023   SpO2 99%   BMI 35.27 kg/m  BP Readings from Last 3 Encounters:  10/17/23 138/85  10/14/23 94/60  06/06/23 122/72      Physical Exam Vitals reviewed.  Constitutional:      General: She is not in acute distress.    Appearance: Normal appearance. She is not ill-appearing, toxic-appearing or diaphoretic.  HENT:     Head: Normocephalic.  Eyes:     General:        Right eye: No discharge.        Left eye: No discharge.     Conjunctiva/sclera: Conjunctivae normal.  Cardiovascular:     Rate and Rhythm: Normal rate.     Pulses: Normal pulses.     Heart sounds: Normal heart sounds.  Pulmonary:     Effort: Pulmonary effort is normal. No respiratory distress.     Breath sounds: Normal breath sounds.  Musculoskeletal:        General: Normal range of motion.      Cervical back: Normal range of motion.  Skin:    General: Skin is warm and dry.     Capillary Refill: Capillary refill takes less than 2 seconds.  Neurological:     Mental Status: She is alert.      No results found for any visits on 10/17/23.  The 10-year ASCVD risk score (Arnett DK, et al., 2019) is: 2.3%    Assessment & Plan:  Prediabetes Assessment & Plan: Last Hemoglobin A1c: 6.2 Labs: Ordered today, results pending; will follow up accordingly. Reviewed non-pharmacological interventions, including a balanced diet rich in lean proteins, healthy fats, whole grains, and high-fiber vegetables. Emphasized reducing refined sugars and processed carbohydrates, and incorporating more fruits, leafy greens, and legumes. Education: Patient was educated on recognizing signs and symptoms of both hypoglycemia and hyperglycemia, and advised to seek emergency care if these symptoms occur.   Orders: -     Hemoglobin A1c  Mixed hyperlipidemia -     Lipid panel  Dyslipidemia (high LDL; low HDL) Assessment & Plan: Lipid Panel     Component Value Date/Time   CHOL 223 (H) 06/06/2023 0943   TRIG 102 06/06/2023 0943   HDL 49 06/06/2023 0943   CHOLHDL 4.6 (H) 06/06/2023 0943   CHOLHDL 2.8 06/19/2016  1531   VLDL 12 06/19/2016 1531   LDLCALC 156 (H) 06/06/2023 0943   LABVLDL 18 06/06/2023 0943   Repeat panel done today Discussed lifestyle modifications and follow diet low in saturated fat.  Diet tips to Lower Cholesterol Eat More: Oats, beans, and lentils: High in soluble fiber. Fatty fish: Salmon, tuna (rich in omega-3s). Nuts and seeds: Almonds, walnuts, flaxseeds. Fruits and vegetables: Apples, berries, leafy greens. Healthy fats: Olive oil, avocado. Limit: Saturated fats: Butter, cream, fatty meats. Trans fats: Fried foods, processed snacks. Sugar and refined carbs: Sweets, white bread. Focus on whole foods, healthy fats, and fiber to improve heart health! Maintain an exercise  routine 3 to 5 days a week for a minimum total of 150 minutes.    Other orders -     Clotrimazole -Betamethasone ; Apply 1 Application topically 2 (two) times daily. For 4 weeks  Dispense: 60 g; Refill: 0    Return in about 6 months (around 04/15/2024), or if symptoms worsen or fail to improve, for pre-diabetes, hyperlipidemia.   Christine Kidd Wilhelmena Falter, FNP

## 2023-10-17 ENCOUNTER — Encounter: Payer: Self-pay | Admitting: Family Medicine

## 2023-10-17 ENCOUNTER — Ambulatory Visit (INDEPENDENT_AMBULATORY_CARE_PROVIDER_SITE_OTHER): Payer: BC Managed Care – PPO | Admitting: Family Medicine

## 2023-10-17 VITALS — BP 138/85 | HR 96 | Ht 63.0 in | Wt 199.1 lb

## 2023-10-17 DIAGNOSIS — E785 Hyperlipidemia, unspecified: Secondary | ICD-10-CM

## 2023-10-17 DIAGNOSIS — R7303 Prediabetes: Secondary | ICD-10-CM

## 2023-10-17 DIAGNOSIS — E782 Mixed hyperlipidemia: Secondary | ICD-10-CM

## 2023-10-17 LAB — SURGICAL PATHOLOGY

## 2023-10-17 MED ORDER — CLOTRIMAZOLE-BETAMETHASONE 1-0.05 % EX CREA: 1.0000 | TOPICAL_CREAM | Freq: Two times a day (BID) | CUTANEOUS | 0 refills | Status: AC

## 2023-10-17 NOTE — Assessment & Plan Note (Signed)
 Lipid Panel     Component Value Date/Time   CHOL 223 (H) 06/06/2023 0943   TRIG 102 06/06/2023 0943   HDL 49 06/06/2023 0943   CHOLHDL 4.6 (H) 06/06/2023 0943   CHOLHDL 2.8 06/19/2016 1531   VLDL 12 06/19/2016 1531   LDLCALC 156 (H) 06/06/2023 0943   LABVLDL 18 06/06/2023 0943   Repeat panel done today Discussed lifestyle modifications and follow diet low in saturated fat.  Diet tips to Lower Cholesterol Eat More: Oats, beans, and lentils: High in soluble fiber. Fatty fish: Salmon, tuna (rich in omega-3s). Nuts and seeds: Almonds, walnuts, flaxseeds. Fruits and vegetables: Apples, berries, leafy greens. Healthy fats: Olive oil, avocado. Limit: Saturated fats: Butter, cream, fatty meats. Trans fats: Fried foods, processed snacks. Sugar and refined carbs: Sweets, white bread. Focus on whole foods, healthy fats, and fiber to improve heart health! Maintain an exercise routine 3 to 5 days a week for a minimum total of 150 minutes.

## 2023-10-17 NOTE — Assessment & Plan Note (Signed)
 Last Hemoglobin A1c: 6.2 Labs: Ordered today, results pending; will follow up accordingly. Reviewed non-pharmacological interventions, including a balanced diet rich in lean proteins, healthy fats, whole grains, and high-fiber vegetables. Emphasized reducing refined sugars and processed carbohydrates, and incorporating more fruits, leafy greens, and legumes. Education: Patient was educated on recognizing signs and symptoms of both hypoglycemia and hyperglycemia, and advised to seek emergency care if these symptoms occur.

## 2023-10-20 DIAGNOSIS — E782 Mixed hyperlipidemia: Secondary | ICD-10-CM | POA: Diagnosis not present

## 2023-10-20 DIAGNOSIS — R7303 Prediabetes: Secondary | ICD-10-CM | POA: Diagnosis not present

## 2023-10-21 LAB — LIPID PANEL
Chol/HDL Ratio: 3.9 {ratio} (ref 0.0–4.4)
Cholesterol, Total: 192 mg/dL (ref 100–199)
HDL: 49 mg/dL (ref >39)
LDL Chol Calc (NIH): 134 mg/dL — ABNORMAL HIGH (ref 0–99)
Triglycerides: 50 mg/dL (ref 0–149)
VLDL Cholesterol Cal: 9 mg/dL (ref 5–40)

## 2023-10-21 LAB — HEMOGLOBIN A1C
Est. average glucose Bld gHb Est-mCnc: 128 mg/dL
Hgb A1c MFr Bld: 6.1 % — ABNORMAL HIGH (ref 4.8–5.6)

## 2023-10-22 ENCOUNTER — Ambulatory Visit: Payer: BC Managed Care – PPO | Admitting: Rehabilitative and Restorative Service Providers"

## 2023-10-22 ENCOUNTER — Encounter: Payer: Self-pay | Admitting: Rehabilitative and Restorative Service Providers"

## 2023-10-22 DIAGNOSIS — M25511 Pain in right shoulder: Secondary | ICD-10-CM

## 2023-10-22 DIAGNOSIS — M25521 Pain in right elbow: Secondary | ICD-10-CM | POA: Diagnosis not present

## 2023-10-22 DIAGNOSIS — M6281 Muscle weakness (generalized): Secondary | ICD-10-CM | POA: Diagnosis not present

## 2023-10-22 DIAGNOSIS — G8929 Other chronic pain: Secondary | ICD-10-CM
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
## 2023-10-24 ENCOUNTER — Encounter (HOSPITAL_COMMUNITY): Payer: Self-pay | Admitting: Internal Medicine

## 2023-11-18 ENCOUNTER — Ambulatory Visit (INDEPENDENT_AMBULATORY_CARE_PROVIDER_SITE_OTHER): Payer: BC Managed Care – PPO | Admitting: Rehabilitative and Restorative Service Providers"

## 2023-11-18 ENCOUNTER — Encounter: Payer: Self-pay | Admitting: Rehabilitative and Restorative Service Providers"

## 2023-11-18 DIAGNOSIS — M25521 Pain in right elbow: Secondary | ICD-10-CM | POA: Diagnosis not present

## 2023-11-18 DIAGNOSIS — M25511 Pain in right shoulder: Secondary | ICD-10-CM

## 2023-11-18 DIAGNOSIS — G8929 Other chronic pain: Secondary | ICD-10-CM | POA: Diagnosis not present

## 2023-11-18 DIAGNOSIS — M6281 Muscle weakness (generalized): Secondary | ICD-10-CM
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
## 2023-11-21 ENCOUNTER — Encounter: Payer: Self-pay | Admitting: Family Medicine

## 2024-01-29 DIAGNOSIS — L918 Other hypertrophic disorders of the skin: Secondary | ICD-10-CM | POA: Diagnosis not present

## 2024-01-29 DIAGNOSIS — D485 Neoplasm of uncertain behavior of skin: Secondary | ICD-10-CM | POA: Diagnosis not present

## 2024-01-29 DIAGNOSIS — D1730 Benign lipomatous neoplasm of skin and subcutaneous tissue of unspecified sites: Secondary | ICD-10-CM | POA: Diagnosis not present

## 2024-03-04 DIAGNOSIS — Z01419 Encounter for gynecological examination (general) (routine) without abnormal findings: Secondary | ICD-10-CM | POA: Diagnosis not present

## 2024-03-04 DIAGNOSIS — Z124 Encounter for screening for malignant neoplasm of cervix: Secondary | ICD-10-CM | POA: Diagnosis not present

## 2024-04-30 ENCOUNTER — Ambulatory Visit: Payer: BC Managed Care – PPO | Admitting: Family Medicine

## 2024-06-04 ENCOUNTER — Ambulatory Visit: Admitting: Family Medicine

## 2024-06-04 ENCOUNTER — Encounter: Payer: Self-pay | Admitting: Family Medicine

## 2024-06-04 VITALS — BP 124/85 | HR 92 | Wt 188.4 lb

## 2024-06-04 DIAGNOSIS — E038 Other specified hypothyroidism: Secondary | ICD-10-CM | POA: Diagnosis not present

## 2024-06-04 DIAGNOSIS — R109 Unspecified abdominal pain: Secondary | ICD-10-CM | POA: Insufficient documentation

## 2024-06-04 DIAGNOSIS — E782 Mixed hyperlipidemia: Secondary | ICD-10-CM | POA: Diagnosis not present

## 2024-06-04 DIAGNOSIS — R1084 Generalized abdominal pain: Secondary | ICD-10-CM

## 2024-06-04 DIAGNOSIS — E559 Vitamin D deficiency, unspecified: Secondary | ICD-10-CM | POA: Diagnosis not present

## 2024-06-04 DIAGNOSIS — R7303 Prediabetes: Secondary | ICD-10-CM | POA: Diagnosis not present

## 2024-06-04 NOTE — Progress Notes (Signed)
 Established Patient Office Visit   Subjective  Patient ID: Christine Fitzgerald, female    DOB: 06/08/1976  Age: 48 y.o. MRN: [id number]  Chief Complaint  Patient presents with   Follow-up    Prediabetes, hyperlipidemia    GI Problem    Left hypochondriac region, has been going on for 2 weeks, intermittent, varies in intensity, sharpe, comes on quick, maybe stress induced    She  has a past medical history of Anxiety, Depression, Headache, History of kidney stones, History of migraine headaches, Hyperlipidemia, Pre-diabetes, Rash of neck (02/04/2015), Seasonal allergies, Stenosing tenosynovitis of finger of right hand (01/2015), and SVD (spontaneous vaginal delivery).  Abdominal Pain: This is a new problem that began about two weeks ago with a gradual onset. The pain occurs intermittently and has been gradually worsening. It is located in the left upper quadrant (LUQ) and is described as sharp and aching, rated 8/10 in severity. The pain does not radiate. Associated symptoms include nausea, but there is no constipation, diarrhea, dysuria, fever, or vomiting. The pain is worsened by being still and relieved by resting. The patient has not tried any treatments, and therefore has had no relief.    Review of Systems  Constitutional:  Negative for chills and fever.  Respiratory:  Negative for shortness of breath.   Cardiovascular:  Negative for chest pain.  Gastrointestinal:  Positive for abdominal pain and nausea. Negative for blood in stool, constipation, diarrhea and vomiting.  Genitourinary:  Negative for dysuria.  Neurological:  Negative for dizziness.      Objective:     BP 124/85 (BP Location: Left Arm, Patient Position: Sitting, Cuff Size: Large)   Pulse 92   Wt 188 lb 7.2 oz (85.5 kg)   BMI 33.38 kg/m  BP Readings from Last 3 Encounters:  06/04/24 124/85  10/17/23 138/85  10/14/23 94/60      Physical Exam Vitals reviewed.  Constitutional:      General: She is not in  acute distress.    Appearance: Normal appearance. She is not ill-appearing, toxic-appearing or diaphoretic.  HENT:     Head: Normocephalic.  Eyes:     General:        Right eye: No discharge.        Left eye: No discharge.     Conjunctiva/sclera: Conjunctivae normal.  Cardiovascular:     Rate and Rhythm: Normal rate.     Pulses: Normal pulses.     Heart sounds: Normal heart sounds.  Pulmonary:     Effort: Pulmonary effort is normal. No respiratory distress.     Breath sounds: Normal breath sounds.  Abdominal:     General: Bowel sounds are normal.     Palpations: Abdomen is soft.     Tenderness: There is no abdominal tenderness. There is no right CVA tenderness, left CVA tenderness or guarding.  Skin:    General: Skin is warm and dry.  Neurological:     Mental Status: She is alert.     Coordination: Coordination normal.     Gait: Gait normal.  Psychiatric:        Mood and Affect: Mood normal.        Behavior: Behavior normal.      No results found for any visits on 06/04/24.  The 10-year ASCVD risk score (Arnett DK, et al., 2019) is: 1.3%    Assessment & Plan:  Mixed hyperlipidemia -     Lipid panel -     BMP8+eGFR -  CBC with Differential/Platelet  TSH (thyroid -stimulating hormone deficiency) -     TSH + free T4  Prediabetes Assessment & Plan: Last Hemoglobin A1c: 6.1 Labs: Ordered today, results pending; will follow up accordingly. Reviewed non-pharmacological interventions, including a balanced diet rich in lean proteins, healthy fats, whole grains, and high-fiber vegetables. Emphasized reducing refined sugars and processed carbohydrates, and incorporating more fruits, leafy greens, and legumes. Education: Patient was educated on recognizing signs and symptoms of both hypoglycemia and hyperglycemia, and advised to seek emergency care if these symptoms occur.   Orders: -     Hemoglobin A1c  Vitamin D  deficiency -     VITAMIN D  25 Hydroxy (Vit-D  Deficiency, Fractures)  Generalized abdominal pain Assessment & Plan: Abdominal US  ordered Advise Increase oral fluid intake. Bland diet as tolerated. Avoid fluids that have a lot sugar or caffeine, Avoid spicy or fatty food. Avoid alcohol. Can take OTC tylenol  for pain. Follow-up in unable to keep food/fluid down x 24 hours, dizziness, fevers, worsening or persistent symptoms to present to ED or contact primary care provider. Patient verbalizes understanding regarding plan of care and all questions answered.   Orders: -     US  Abdomen Complete; Future    Return in about 6 months (around 12/05/2024), or if symptoms worsen or fail to improve, for hyperlipidemia, pre-diabetes.   Hilario Kidd Wilhelmena Falter, FNP

## 2024-06-04 NOTE — Assessment & Plan Note (Signed)
 Last Hemoglobin A1c: 6.1 Labs: Ordered today, results pending; will follow up accordingly. Reviewed non-pharmacological interventions, including a balanced diet rich in lean proteins, healthy fats, whole grains, and high-fiber vegetables. Emphasized reducing refined sugars and processed carbohydrates, and incorporating more fruits, leafy greens, and legumes. Education: Patient was educated on recognizing signs and symptoms of both hypoglycemia and hyperglycemia, and advised to seek emergency care if these symptoms occur.

## 2024-06-04 NOTE — Assessment & Plan Note (Signed)
 Abdominal US ordered Advise Increase oral fluid intake. Bland diet as tolerated. Avoid fluids that have a lot sugar or caffeine, Avoid spicy or fatty food. Avoid alcohol. Can take OTC tylenol for pain. Follow-up in unable to keep food/fluid down x 24 hours, dizziness, fevers, worsening or persistent symptoms to present to ED or contact primary care provider. Patient verbalizes understanding regarding plan of care and all questions answered.

## 2024-06-04 NOTE — Patient Instructions (Signed)

## 2024-06-05 LAB — CBC WITH DIFFERENTIAL/PLATELET
Basophils Absolute: 0 x10E3/uL (ref 0.0–0.2)
Basos: 1 %
EOS (ABSOLUTE): 0.2 x10E3/uL (ref 0.0–0.4)
Eos: 3 %
Hematocrit: 41.7 % (ref 34.0–46.6)
Hemoglobin: 13.7 g/dL (ref 11.1–15.9)
Immature Grans (Abs): 0 x10E3/uL (ref 0.0–0.1)
Immature Granulocytes: 0 %
Lymphocytes Absolute: 1.3 x10E3/uL (ref 0.7–3.1)
Lymphs: 19 %
MCH: 29.6 pg (ref 26.6–33.0)
MCHC: 32.9 g/dL (ref 31.5–35.7)
MCV: 90 fL (ref 79–97)
Monocytes Absolute: 0.4 x10E3/uL (ref 0.1–0.9)
Monocytes: 6 %
Neutrophils Absolute: 4.9 x10E3/uL (ref 1.4–7.0)
Neutrophils: 71 %
Platelets: 383 x10E3/uL (ref 150–450)
RBC: 4.63 x10E6/uL (ref 3.77–5.28)
RDW: 12.1 % (ref 11.7–15.4)
WBC: 6.9 x10E3/uL (ref 3.4–10.8)

## 2024-06-05 LAB — LIPID PANEL
Chol/HDL Ratio: 3.5 ratio (ref 0.0–4.4)
Cholesterol, Total: 176 mg/dL (ref 100–199)
HDL: 50 mg/dL (ref >39)
LDL Chol Calc (NIH): 112 mg/dL — ABNORMAL HIGH (ref 0–99)
Triglycerides: 75 mg/dL (ref 0–149)
VLDL Cholesterol Cal: 14 mg/dL (ref 5–40)

## 2024-06-05 LAB — HEMOGLOBIN A1C
Est. average glucose Bld gHb Est-mCnc: 117 mg/dL
Hgb A1c MFr Bld: 5.7 % — ABNORMAL HIGH (ref 4.8–5.6)

## 2024-06-05 LAB — BMP8+EGFR
BUN/Creatinine Ratio: 13 (ref 9–23)
BUN: 10 mg/dL (ref 6–24)
CO2: 22 mmol/L (ref 20–29)
Calcium: 10 mg/dL (ref 8.7–10.2)
Chloride: 103 mmol/L (ref 96–106)
Creatinine, Ser: 0.79 mg/dL (ref 0.57–1.00)
Glucose: 88 mg/dL (ref 70–99)
Potassium: 4.1 mmol/L (ref 3.5–5.2)
Sodium: 138 mmol/L (ref 134–144)
eGFR: 93 mL/min/1.73 (ref >59)

## 2024-06-05 LAB — TSH+FREE T4
Free T4: 1.08 ng/dL (ref 0.82–1.77)
TSH: 1.41 u[IU]/mL (ref 0.450–4.500)

## 2024-06-05 LAB — VITAMIN D 25 HYDROXY (VIT D DEFICIENCY, FRACTURES): Vit D, 25-Hydroxy: 23 ng/mL — ABNORMAL LOW (ref 30.0–100.0)

## 2024-06-07 ENCOUNTER — Ambulatory Visit: Payer: Self-pay | Admitting: Family Medicine

## 2024-06-16 ENCOUNTER — Ambulatory Visit (HOSPITAL_COMMUNITY)
Admission: RE | Admit: 2024-06-16 | Discharge: 2024-06-16 | Disposition: A | Discharge status: Home / Self Care | Source: Ambulatory Visit | Attending: Family Medicine | Admitting: Family Medicine

## 2024-06-16 DIAGNOSIS — R1084 Generalized abdominal pain: Secondary | ICD-10-CM | POA: Insufficient documentation

## 2024-09-21 ENCOUNTER — Other Ambulatory Visit: Payer: Self-pay | Admitting: Family Medicine

## 2024-09-21 DIAGNOSIS — Z1231 Encounter for screening mammogram for malignant neoplasm of breast: Secondary | ICD-10-CM

## 2024-10-19 ENCOUNTER — Ambulatory Visit
Admission: RE | Admit: 2024-10-19 | Discharge: 2024-10-19 | Disposition: A | Discharge status: Home / Self Care | Source: Ambulatory Visit | Attending: Family Medicine | Admitting: Family Medicine

## 2024-10-19 DIAGNOSIS — Z1231 Encounter for screening mammogram for malignant neoplasm of breast: Secondary | ICD-10-CM
